# Patient Record
Sex: Male | Born: 1940 | Race: White | Hispanic: No | Marital: Married | Smoking: Former smoker
Health system: Southern US, Community
[De-identification: ages and names within clinical notes are randomized; demographics above are authoritative.]

## PROBLEM LIST (undated history)

## (undated) DIAGNOSIS — N400 Enlarged prostate without lower urinary tract symptoms: Secondary | ICD-10-CM

## (undated) DIAGNOSIS — M109 Gout, unspecified: Secondary | ICD-10-CM

## (undated) DIAGNOSIS — N329 Bladder disorder, unspecified: Secondary | ICD-10-CM

## (undated) DIAGNOSIS — Z87442 Personal history of urinary calculi: Secondary | ICD-10-CM

## (undated) HISTORY — PX: NO PAST SURGERIES: SHX2092

## (undated) HISTORY — PX: COLONOSCOPY: SHX174

---

## 2012-06-14 ENCOUNTER — Encounter (HOSPITAL_BASED_OUTPATIENT_CLINIC_OR_DEPARTMENT_OTHER): Payer: Self-pay

## 2012-06-14 ENCOUNTER — Emergency Department (HOSPITAL_BASED_OUTPATIENT_CLINIC_OR_DEPARTMENT_OTHER)
Admission: EM | Admit: 2012-06-14 | Discharge: 2012-06-14 | Disposition: A | Discharge status: Home / Self Care | Payer: Medicare Other | Attending: Emergency Medicine | Admitting: Emergency Medicine

## 2012-06-14 DIAGNOSIS — R102 Pelvic and perineal pain: Secondary | ICD-10-CM

## 2012-06-14 DIAGNOSIS — N329 Bladder disorder, unspecified: Secondary | ICD-10-CM | POA: Insufficient documentation

## 2012-06-14 DIAGNOSIS — R109 Unspecified abdominal pain: Secondary | ICD-10-CM | POA: Insufficient documentation

## 2012-06-14 HISTORY — DX: Bladder disorder, unspecified: N32.9

## 2012-06-14 LAB — URINALYSIS, ROUTINE W REFLEX MICROSCOPIC
Bilirubin Urine: NEGATIVE
Hgb urine dipstick: NEGATIVE
Specific Gravity, Urine: 1.017 (ref 1.005–1.030)
pH: 6 (ref 5.0–8.0)

## 2012-06-14 NOTE — ED Notes (Signed)
Pt reports urinary retention.  He was seen by PMD yesterday but is still having difficulty emptying bladder.

## 2012-06-14 NOTE — ED Notes (Signed)
MD at bedside. 

## 2012-06-14 NOTE — ED Provider Notes (Signed)
I saw and evaluated the patient, reviewed the resident's note and I agree with the findings and plan.   .Face to face Exam:  General:  Awake HEENT:  Atraumatic Resp:  Normal effort Abd:  Nondistended Neuro:No focal weakness Lymph: No adenopathy   Nelia Shi, MD 06/14/12 1055

## 2012-06-14 NOTE — ED Provider Notes (Signed)
History     CSN: [id number]  Arrival date & time 06/14/12  7829   First MD Initiated Contact with Patient 06/14/12 [phone]      Chief Complaint  Patient presents with  . Urinary Retention    (Consider location/radiation/quality/duration/timing/severity/associated sxs/prior treatment) HPI Patient presents with complaints of suprapubic pain and difficulty urinating since 1200 yesterday. He states that he has been able to urinate since that time, but has had to strain significantly to void. He states that the volume of urine he has produced in this time might be decreased from normal, but he is not certain. Admits to mild dysuria for the same period of time. Denies hematuria. Denies flank pain.   Patient states he is seen regularly by a urologist, Dr. Sherron Monday, for what sounds to be BPH. He is prescribed flomax which he takes everyday. He also states he receives a treatment every 3 weeks for this issue, but is unsure of specifics. He denies any recent changes to his medications, and states flomax is the only med he takes. Denies any previous episodes of retention, or the need for bladder catheterization.   Past Medical History  Diagnosis Date  . Bladder troubles     History reviewed. No pertinent past surgical history.  History reviewed. No pertinent family history.  History  Substance Use Topics  . Smoking status: Never Smoker   . Smokeless tobacco: Never Used  . Alcohol Use: No      Review of Systems  All other systems reviewed and are negative.    Allergies  Review of patient's allergies indicates no known allergies.  Home Medications  No current outpatient prescriptions on file.  BP 146/88  Pulse 97  Temp 97.8 F (36.6 C) (Oral)  Resp 18  Ht 5\' 6"  (1.676 m)  Wt 145 lb (65.772 kg)  BMI 23.40 kg/m2  SpO2 99%  Physical Exam  Constitutional: He is oriented to person, place, and time. He appears well-developed and well-nourished. No distress.  HENT:  Head:  Normocephalic and atraumatic.  Eyes: Pupils are equal, round, and reactive to light.  Cardiovascular: Normal rate and regular rhythm.   No murmur heard. Pulmonary/Chest: Effort normal. He has no wheezes. He has no rales.  Abdominal: Soft. He exhibits no distension. There is tenderness (suprapubic). There is no rebound and no guarding.  Neurological: He is alert and oriented to person, place, and time.  Skin: Skin is warm and dry. No rash noted.  Psychiatric: He has a normal mood and affect. His behavior is normal.    ED Course  Procedures (including critical care time)   Labs Reviewed  URINALYSIS, ROUTINE W REFLEX MICROSCOPIC   No results found.   1. Suprapubic pain, acute       MDM  Patient unable to void after 30 minutes in the ED. Foley catheter placed, with only ~150cc or urine produced. Foley d/c'd. Patient is able to urinate, although with some straining. Patient instructed to f/u with Dr. Sherron Monday, as he does not appear to have acute urinary retention.        Elfredia Nevins, MD 06/14/12 [phone]

## 2012-06-30 ENCOUNTER — Encounter (HOSPITAL_BASED_OUTPATIENT_CLINIC_OR_DEPARTMENT_OTHER): Payer: Self-pay | Admitting: *Deleted

## 2012-06-30 ENCOUNTER — Emergency Department (HOSPITAL_BASED_OUTPATIENT_CLINIC_OR_DEPARTMENT_OTHER)
Admission: EM | Admit: 2012-06-30 | Discharge: 2012-06-30 | Disposition: A | Discharge status: Home / Self Care | Payer: Medicare Other | Attending: Emergency Medicine | Admitting: Emergency Medicine

## 2012-06-30 DIAGNOSIS — R339 Retention of urine, unspecified: Secondary | ICD-10-CM

## 2012-06-30 DIAGNOSIS — Z87448 Personal history of other diseases of urinary system: Secondary | ICD-10-CM | POA: Insufficient documentation

## 2012-06-30 LAB — URINE MICROSCOPIC-ADD ON

## 2012-06-30 LAB — URINALYSIS, ROUTINE W REFLEX MICROSCOPIC
Glucose, UA: NEGATIVE mg/dL
Specific Gravity, Urine: 1.012 (ref 1.005–1.030)
pH: 6 (ref 5.0–8.0)

## 2012-06-30 NOTE — ED Notes (Signed)
States he is not urinating like he should. Poor historian. Drove himself here.

## 2012-06-30 NOTE — ED Provider Notes (Signed)
History     CSN: [id number]  Arrival date & time 06/30/12  1533   None     Chief Complaint  Patient presents with  . Urinary Retention    (Consider location/radiation/quality/duration/timing/severity/associated sxs/prior treatment) HPI Comments: Patient complains of inability to urinate. Patient made multiple attempts while in the emergency department. Patient request Foley catheter do to discomfort. Patient immediately wanted catheter removed after having placed a refill reports feeling better and does not want catheter to remain and wants to go home. Patient reports this has happened him multiple times in the past and he is usually able to resume urination after pressure is removed from his bladder. Patient is followed by Dr. Marius Ditch and since the end is currently on Flomax. Patient reports nothing makes urination easier nothing seems to make it worse he denies any fever or chills he denies any burning with urination he denies any symptoms of urinary tract infection  The history is provided by the patient. No language interpreter was used.    Past Medical History  Diagnosis Date  . Bladder troubles     History reviewed. No pertinent past surgical history.  No family history on file.  History  Substance Use Topics  . Smoking status: Never Smoker   . Smokeless tobacco: Never Used  . Alcohol Use: No      Review of Systems  Genitourinary: Positive for decreased urine volume.  All other systems reviewed and are negative.    Allergies  Review of patient's allergies indicates no known allergies.  Home Medications  No current outpatient prescriptions on file.  BP 137/84  Pulse 108  Temp 97.7 F (36.5 C) (Oral)  Resp 18  Ht 5\' 5"  (1.651 m)  Wt 120 lb (54.432 kg)  BMI 19.97 kg/m2  SpO2 97%  Physical Exam  Nursing note and vitals reviewed. Constitutional: He appears well-developed and well-nourished.  HENT:  Head: Normocephalic and atraumatic.    Cardiovascular: Normal rate and normal heart sounds.   Pulmonary/Chest: Effort normal and breath sounds normal.  Abdominal: Soft. Bowel sounds are normal.  Musculoskeletal: Normal range of motion.  Neurological: He is alert.  Skin: Skin is warm.  Psychiatric: He has a normal mood and affect.    ED Course  Procedures (including critical care time)  Labs Reviewed  URINALYSIS, ROUTINE W REFLEX MICROSCOPIC - Abnormal; Notable for the following:    Hgb urine dipstick SMALL (*)     All other components within normal limits  URINE MICROSCOPIC-ADD ON   No results found.   1. Urinary retention       MDM  Patient had 300 cc of urine output. Urinalysis is negative. I advised patient that he may have further retention he does not want Foley left in. I advised him to followup with Dr. Jacquelyne Balint and he can return to the emergency department if symptoms worsen or change.       Lonia Skinner [place], [place] 06/30/12 1729

## 2012-06-30 NOTE — ED Provider Notes (Signed)
Medical screening examination/treatment/procedure(s) were performed by non-physician practitioner and as supervising physician I was immediately available for consultation/collaboration.  Ethelda Chick, MD 06/30/12 [phone]

## 2012-08-05 ENCOUNTER — Encounter (HOSPITAL_COMMUNITY): Payer: Self-pay | Admitting: Emergency Medicine

## 2012-08-05 ENCOUNTER — Inpatient Hospital Stay (HOSPITAL_COMMUNITY)
Admission: EM | Admit: 2012-08-05 | Discharge: 2012-08-11 | DRG: 371 | Disposition: A | Discharge status: Skilled Nursing Facility | Payer: Medicare Other | Attending: Internal Medicine | Admitting: Internal Medicine

## 2012-08-05 ENCOUNTER — Emergency Department (HOSPITAL_COMMUNITY): Payer: Medicare Other

## 2012-08-05 DIAGNOSIS — R4182 Altered mental status, unspecified: Secondary | ICD-10-CM

## 2012-08-05 DIAGNOSIS — R5383 Other fatigue: Secondary | ICD-10-CM | POA: Diagnosis present

## 2012-08-05 DIAGNOSIS — R197 Diarrhea, unspecified: Secondary | ICD-10-CM

## 2012-08-05 DIAGNOSIS — E46 Unspecified protein-calorie malnutrition: Secondary | ICD-10-CM

## 2012-08-05 DIAGNOSIS — F32A Depression, unspecified: Secondary | ICD-10-CM

## 2012-08-05 DIAGNOSIS — Z9181 History of falling: Secondary | ICD-10-CM

## 2012-08-05 DIAGNOSIS — D72829 Elevated white blood cell count, unspecified: Secondary | ICD-10-CM

## 2012-08-05 DIAGNOSIS — F329 Major depressive disorder, single episode, unspecified: Secondary | ICD-10-CM | POA: Diagnosis present

## 2012-08-05 DIAGNOSIS — N138 Other obstructive and reflux uropathy: Secondary | ICD-10-CM | POA: Diagnosis present

## 2012-08-05 DIAGNOSIS — IMO0002 Reserved for concepts with insufficient information to code with codable children: Secondary | ICD-10-CM

## 2012-08-05 DIAGNOSIS — A0472 Enterocolitis due to Clostridium difficile, not specified as recurrent: Principal | ICD-10-CM

## 2012-08-05 DIAGNOSIS — R339 Retention of urine, unspecified: Secondary | ICD-10-CM

## 2012-08-05 DIAGNOSIS — N390 Urinary tract infection, site not specified: Secondary | ICD-10-CM

## 2012-08-05 DIAGNOSIS — Z79899 Other long term (current) drug therapy: Secondary | ICD-10-CM

## 2012-08-05 DIAGNOSIS — Z681 Body mass index (BMI) 19 or less, adult: Secondary | ICD-10-CM

## 2012-08-05 DIAGNOSIS — E43 Unspecified severe protein-calorie malnutrition: Secondary | ICD-10-CM | POA: Diagnosis present

## 2012-08-05 DIAGNOSIS — Z87891 Personal history of nicotine dependence: Secondary | ICD-10-CM

## 2012-08-05 DIAGNOSIS — R5381 Other malaise: Secondary | ICD-10-CM | POA: Diagnosis present

## 2012-08-05 DIAGNOSIS — E860 Dehydration: Secondary | ICD-10-CM

## 2012-08-05 DIAGNOSIS — R627 Adult failure to thrive: Secondary | ICD-10-CM | POA: Diagnosis present

## 2012-08-05 DIAGNOSIS — F3289 Other specified depressive episodes: Secondary | ICD-10-CM | POA: Diagnosis present

## 2012-08-05 DIAGNOSIS — N401 Enlarged prostate with lower urinary tract symptoms: Secondary | ICD-10-CM | POA: Diagnosis present

## 2012-08-05 DIAGNOSIS — M109 Gout, unspecified: Secondary | ICD-10-CM

## 2012-08-05 HISTORY — DX: Gout, unspecified: M10.9

## 2012-08-05 LAB — CBC
HCT: 41.6 % (ref 39.0–52.0)
MCH: 29.7 pg (ref 26.0–34.0)
MCH: 29 pg (ref 26.0–34.0)
MCV: 86.3 fL (ref 78.0–100.0)
Platelets: 222 10*3/uL (ref 150–400)
Platelets: 204 10*3/uL (ref 150–400)
RBC: 5.28 MIL/uL (ref 4.22–5.81)
RBC: 4.82 MIL/uL (ref 4.22–5.81)
WBC: 13.6 10*3/uL — ABNORMAL HIGH (ref 4.0–10.5)

## 2012-08-05 LAB — COMPREHENSIVE METABOLIC PANEL
ALT: 26 U/L (ref 0–53)
AST: 20 U/L (ref 0–37)
CO2: 27 mEq/L (ref 19–32)
Calcium: 9.7 mg/dL (ref 8.4–10.5)
Chloride: 96 mEq/L (ref 96–112)
GFR calc non Af Amer: 84 mL/min — ABNORMAL LOW (ref >90)
Sodium: 133 mEq/L — ABNORMAL LOW (ref 135–145)

## 2012-08-05 LAB — URINALYSIS, ROUTINE W REFLEX MICROSCOPIC
Glucose, UA: NEGATIVE mg/dL
Protein, ur: NEGATIVE mg/dL
Specific Gravity, Urine: 1.015 (ref 1.005–1.030)

## 2012-08-05 LAB — URINE MICROSCOPIC-ADD ON

## 2012-08-05 LAB — PHOSPHORUS: Phosphorus: 2.7 mg/dL (ref 2.3–4.6)

## 2012-08-05 LAB — OCCULT BLOOD X 1 CARD TO LAB, STOOL: Fecal Occult Bld: NEGATIVE

## 2012-08-05 MED ORDER — DEXTROSE 5 % IV SOLN
1.0000 g | INTRAVENOUS | Status: DC
  Administered 2012-08-05: 1 g via INTRAVENOUS
  Filled 2012-08-05 (×2): qty 10

## 2012-08-05 MED ORDER — SODIUM CHLORIDE 0.9 % IV SOLN: INTRAVENOUS | Status: DC

## 2012-08-05 MED ORDER — ACETAMINOPHEN 325 MG PO TABS
650.0000 mg | ORAL_TABLET | Freq: Four times a day (QID) | ORAL | Status: DC | PRN
  Administered 2012-08-09 – 2012-08-10 (×2): 650 mg via ORAL
  Filled 2012-08-05 (×3): qty 2

## 2012-08-05 MED ORDER — ENSURE COMPLETE PO LIQD
237.0000 mL | Freq: Three times a day (TID) | ORAL | Status: DC
  Administered 2012-08-05 – 2012-08-11 (×16): 237 mL via ORAL
  Filled 2012-08-05 (×2): qty 237

## 2012-08-05 MED ORDER — CLONAZEPAM 0.5 MG PO TABS
0.5000 mg | ORAL_TABLET | Freq: Every evening | ORAL | Status: DC | PRN
  Administered 2012-08-05 – 2012-08-11 (×5): 0.5 mg via ORAL
  Filled 2012-08-05 (×5): qty 1

## 2012-08-05 MED ORDER — SODIUM CHLORIDE 0.9 % IV SOLN
1000.0000 mL | INTRAVENOUS | Status: DC
  Administered 2012-08-05: 1000 mL via INTRAVENOUS

## 2012-08-05 MED ORDER — ENOXAPARIN SODIUM 30 MG/0.3ML ~~LOC~~ SOLN: 30.0000 mg | SUBCUTANEOUS | Status: DC

## 2012-08-05 MED ORDER — SODIUM CHLORIDE 0.9 % IV SOLN
INTRAVENOUS | Status: DC
  Administered 2012-08-05 – 2012-08-06 (×4): via INTRAVENOUS

## 2012-08-05 MED ORDER — ONDANSETRON HCL 4 MG PO TABS: 4.0000 mg | ORAL_TABLET | Freq: Four times a day (QID) | ORAL | Status: DC | PRN

## 2012-08-05 MED ORDER — FINASTERIDE 5 MG PO TABS
5.0000 mg | ORAL_TABLET | Freq: Every day | ORAL | Status: DC
  Administered 2012-08-05 – 2012-08-11 (×7): 5 mg via ORAL
  Filled 2012-08-05 (×7): qty 1

## 2012-08-05 MED ORDER — ACETAMINOPHEN 650 MG RE SUPP: 650.0000 mg | Freq: Four times a day (QID) | RECTAL | Status: DC | PRN

## 2012-08-05 MED ORDER — TAMSULOSIN HCL 0.4 MG PO CAPS
0.8000 mg | ORAL_CAPSULE | Freq: Every day | ORAL | Status: DC
  Administered 2012-08-05 – 2012-08-11 (×7): 0.8 mg via ORAL
  Filled 2012-08-05 (×7): qty 2

## 2012-08-05 MED ORDER — ONDANSETRON HCL 4 MG/2ML IJ SOLN: 4.0000 mg | Freq: Four times a day (QID) | INTRAMUSCULAR | Status: DC | PRN

## 2012-08-05 MED ORDER — ENOXAPARIN SODIUM 40 MG/0.4ML ~~LOC~~ SOLN
40.0000 mg | SUBCUTANEOUS | Status: DC
  Administered 2012-08-05 – 2012-08-10 (×6): 40 mg via SUBCUTANEOUS
  Filled 2012-08-05 (×7): qty 0.4

## 2012-08-05 MED ORDER — SODIUM CHLORIDE 0.9 % IV SOLN
1000.0000 mL | Freq: Once | INTRAVENOUS | Status: AC
  Administered 2012-08-05: 1000 mL via INTRAVENOUS

## 2012-08-05 MED ORDER — PHENAZOPYRIDINE HCL 200 MG PO TABS
200.0000 mg | ORAL_TABLET | Freq: Three times a day (TID) | ORAL | Status: DC | PRN
  Administered 2012-08-05 – 2012-08-11 (×7): 200 mg via ORAL
  Filled 2012-08-05 (×4): qty 1

## 2012-08-05 NOTE — ED Notes (Signed)
Pt's family states that pt has been getting increasingly weak.  Has not been eating and drinking well.  Has been seeing doctors for urinary retention but no one can figure out why he is so weak.  Has to be picked up to be put into bed.

## 2012-08-05 NOTE — ED Provider Notes (Signed)
Medical screening examination/treatment/procedure(s) were conducted as a shared visit with non-physician practitioner(s) and myself.  I personally evaluated the patient during the encounter   The patient presents with what appears to be failure to thrive with significant decrease in his cognitive function over the past 3 weeks as well as decreased oral intake.  He has lost a significant amount weight.  Workup in the emergency department is without acute abnormality however I think the patient needs additional medical workup.  The patient also has been having difficulty ambulating without assistance.  He may need physical therapy/occupational therapy/short-term rehabilitation placement.  I spoke with social work who will involve themselves as well as case management.  I also spoke with the hospitalist service to admit this patient the hospital.   Date: 08/05/2012  Rate: 104  Rhythm: Sinus tachycardia  QRS Axis: normal  Intervals: normal  ST/T Wave abnormalities: normal  Conduction Disutrbances: none  Narrative Interpretation:   Old EKG Reviewed: No significant changes noted     Lyanne Co, MD 08/05/12 [phone]

## 2012-08-05 NOTE — Progress Notes (Signed)
Rx Brief Lovenox note Pt wt=54 kg, CrCl >30 Plts=222 Adjusted Lovenox to 40mg  SQ daily for DVT prophylaxis  Lorenza Evangelist 08/05/2012 5:02 PM

## 2012-08-05 NOTE — Progress Notes (Addendum)
CSW received referral for potential disposition needs. CSW attempted to assess patient however pt was being transferred to inpatient floor. CSW will continue to follow to complete assessment and assess for potential disposition and csw needs.   [name], LCSWA  [phone] .08/05/2012 1600  Per chart review, physical therapy and occupational therapy evaluation ordered. Per chart review, patient receiving home health services. Only RN case management orders at this time, for continued home health services. Please consult csw if further needs arise.   [name], LCSWA  [phone]  1900pm

## 2012-08-05 NOTE — ED Notes (Signed)
MD at bedside. 

## 2012-08-05 NOTE — ED Provider Notes (Signed)
History     CSN: [id number]  Arrival date & time 08/05/12  4098   First MD Initiated Contact with Patient 08/05/12 1014      Chief Complaint  Patient presents with  . Weakness  . Failure To Thrive    (Consider location/radiation/quality/duration/timing/severity/associated sxs/prior treatment) HPI Comments: This is a 71 year old male, who presents emergency department with a chief complaint of weakness and failure to thrive. Patient is brought in by his son, who is in town, and states that his father is "widdling away." Patient has been seen her recently for urinary retention. Son states that his father has had a weight loss of approximately 30 pounds past couple of months. He notes that the father had some mental decline.  The patient has not been eating or drinking well.  The son also states that the patient "does sit-ups constantly and unintentionally."  He is too weak to walk.  The history is provided by the patient. No language interpreter was used.    Past Medical History  Diagnosis Date  . Bladder troubles     History reviewed. No pertinent past surgical history.  History reviewed. No pertinent family history.  History  Substance Use Topics  . Smoking status: Never Smoker   . Smokeless tobacco: Never Used  . Alcohol Use: No      Review of Systems  All other systems reviewed and are negative.    Allergies  Review of patient's allergies indicates no known allergies.  Home Medications  No current outpatient prescriptions on file.  BP 110/83  Pulse 109  Temp 97.5 F (36.4 C) (Oral)  Resp 16  SpO2 94%  Physical Exam  Nursing note and vitals reviewed. Constitutional: He is oriented to person, place, and time.       Thin, ill-appearing  HENT:  Head: Normocephalic and atraumatic.  Nose: Nose normal.  Mouth/Throat: Oropharynx is clear and moist. No oropharyngeal exudate.  Eyes: Conjunctivae normal and EOM are normal. Pupils are equal, round, and reactive  to light. Right eye exhibits no discharge. Left eye exhibits no discharge. No scleral icterus.  Neck: Normal range of motion. Neck supple. No JVD present.  Cardiovascular: Normal rate, regular rhythm, normal heart sounds and intact distal pulses.  Exam reveals no gallop and no friction rub.   No murmur heard. Pulmonary/Chest: Effort normal and breath sounds normal. No respiratory distress. He has no wheezes. He has no rales. He exhibits no tenderness.  Abdominal: Soft. Bowel sounds are normal. He exhibits no distension and no mass. There is no tenderness. There is no rebound and no guarding.  Musculoskeletal: Normal range of motion. He exhibits no edema and no tenderness.       Repeated abdominal flexion, almost tic-like  Neurological: He is alert and oriented to person, place, and time. He has normal reflexes.       CN 3-12 intact  Skin: Skin is warm and dry.  Psychiatric: He has a normal mood and affect. His behavior is normal. Judgment and thought content normal.    ED Course  Procedures (including critical care time)   Labs Reviewed  CBC  COMPREHENSIVE METABOLIC PANEL  TROPONIN I  URINALYSIS, ROUTINE W REFLEX MICROSCOPIC   Results for orders placed during the hospital encounter of 08/05/12  CBC      Component Value Range   WBC 13.6 (*) 4.0 - 10.5 K/uL   RBC 5.28  4.22 - 5.81 MIL/uL   Hemoglobin 15.7  13.0 - 17.0 g/dL  HCT 44.7  39.0 - 52.0 %   MCV 84.7  78.0 - 100.0 fL   MCH 29.7  26.0 - 34.0 pg   MCHC 35.1  30.0 - 36.0 g/dL   RDW 16.1  09.6 - 04.5 %   Platelets 222  150 - 400 K/uL  COMPREHENSIVE METABOLIC PANEL      Component Value Range   Sodium 133 (*) 135 - 145 mEq/L   Potassium 4.0  3.5 - 5.1 mEq/L   Chloride 96  96 - 112 mEq/L   CO2 27  19 - 32 mEq/L   Glucose, Bld 116 (*) 70 - 99 mg/dL   BUN 19  6 - 23 mg/dL   Creatinine, Ser 4.09  0.50 - 1.35 mg/dL   Calcium 9.7  8.4 - 81.1 mg/dL   Total Protein 7.0  6.0 - 8.3 g/dL   Albumin 3.4 (*) 3.5 - 5.2 g/dL   AST 20   0 - 37 U/L   ALT 26  0 - 53 U/L   Alkaline Phosphatase 80  39 - 117 U/L   Total Bilirubin 0.7  0.3 - 1.2 mg/dL   GFR calc non Af Amer 84 (*) >90 mL/min   GFR calc Af Amer >90  >90 mL/min  TROPONIN I      Component Value Range   Troponin I <0.30  <0.30 ng/mL  URINALYSIS, ROUTINE W REFLEX MICROSCOPIC      Component Value Range   Color, Urine ORANGE (*) YELLOW   APPearance CLEAR  CLEAR   Specific Gravity, Urine 1.015  1.005 - 1.030   pH 5.5  5.0 - 8.0   Glucose, UA NEGATIVE  NEGATIVE mg/dL   Hgb urine dipstick SMALL (*) NEGATIVE   Bilirubin Urine SMALL (*) NEGATIVE   Ketones, ur TRACE (*) NEGATIVE mg/dL   Protein, ur NEGATIVE  NEGATIVE mg/dL   Urobilinogen, UA 1.0  0.0 - 1.0 mg/dL   Nitrite POSITIVE (*) NEGATIVE   Leukocytes, UA TRACE (*) NEGATIVE  URINE MICROSCOPIC-ADD ON      Component Value Range   Squamous Epithelial / LPF RARE  RARE   WBC, UA 0-2  <3 WBC/hpf   RBC / HPF 7-10  <3 RBC/hpf   Bacteria, UA FEW (*) RARE   Dg Chest 2 View  08/05/2012  *RADIOLOGY REPORT*  Clinical Data: Weakness, failure to thrive.  CHEST - 2 VIEW  Comparison: None.  Findings: Heart is normal size.  Lungs are clear.  No effusions. No acute bony abnormality.  IMPRESSION: No acute cardiopulmonary disease.   Original Report Authenticated By: Charlett Nose, M.D.    Ct Head Wo Contrast  08/05/2012  *RADIOLOGY REPORT*  Clinical Data: Weakness, failure to thrive.  CT HEAD WITHOUT CONTRAST  Technique:  Contiguous axial images were obtained from the base of the skull through the vertex without contrast.  Comparison: None.  Findings: Atherosclerotic and physiologic intracranial calcifications. Diffuse parenchymal atrophy. Patchy areas of hypoattenuation in deep and periventricular white matter bilaterally. Negative for acute intracranial hemorrhage, mass lesion, acute infarction, midline shift, or mass-effect. Acute infarct may be inapparent on noncontrast CT. Ventricles and sulci symmetric. Bone windows  demonstrate no focal lesion.  IMPRESSION:  1. Negative for bleed or other acute intracranial process.  2. Atrophy and nonspecific white matter changes   Original Report Authenticated By: D. Andria Rhein, MD       1. Altered mental status       MDM  71 year old male with failure to  thrive.  Discussed the patient with Dr. Patria Mane, who has agreed to also see the patient.  Will order labs and give fluids.  3:47 PM Patient will admitted for further workup of weight loss and failure to thrive.  Dr. Patria Mane called for admission.       Roxy Horseman, PA-C 08/05/12 [phone]

## 2012-08-05 NOTE — ED Notes (Signed)
RN to obtain labs with start of IV 

## 2012-08-05 NOTE — ED Notes (Signed)
PA at bedside.

## 2012-08-05 NOTE — H&P (Addendum)
[facility] History and Physical  Brian Salinas ZOX:[id number] DOB: [birth year] DOA: 08/05/2012  Referring physician: Dr Patria Mane. PCP: Sid Falcon, MD    Chief Complaint: Weakness, unable to walk.   HPI: Brian Salinas is a 71 y.o. male with PMH significant for BPH ?, Gout who was brought to the ED by family because patient has been very weak, unable to walk, unable to feed him self, not eating for last  2 weeks,  worse over last 3 days. He has had problems with prostate enlargement. He saw an urologist and was started on medications. Per son, patient sometimes get confuse. The son also states that the patient "does sit-ups constantly and unintentionally." Over the last 4 months he has lost significant amount of weight. Patient has been declining over last 4 month but worse over last 2 weeks.  Patient fell the  night prior to admission when he was trying to stand up from a chair. Patient relates he felt  lightheaded prior to the fall.  Patients relates dysuria, increase frequency, decrease urine stream. He was not able to tell me when this symptoms started. He also relates diarrhea, multiple BM, watery. He denies vomiting, abdominal pain.  Patient is able to follow command, he is oriented to place and person, year. He said he has 3 children, 2 boy 1 girl. He is retired, he works in [employer].  Patient is frequently saying I need to urinate. I need to stand up.   Review of Systems: The patient denies  fever, vision loss, decreased hearing, hoarseness, chest pain, syncope, peripheral edema, hemoptysis, abdominal pain, melena, hematochezia, severe indigestion/heartburn, genital sores, muscle weakness,  transient blindness, difficulty walking, depression, abnormal bleeding.   Past Medical History  Diagnosis Date  . Bladder troubles   . Gout    Past Surgical History  Procedure Date  . No past surgeries    Social History:  reports that he quit smoking about 15 years ago. His  smoking use included Cigarettes. He quit after 20 years of use. He has never used smokeless tobacco. He reports that he does not drink alcohol or use illicit drugs. He lives with son.   No Known Allergies  Family History: Non Contributory.   Prior to Admission medications   Medication Sig Start Date End Date Taking? Authorizing Provider  clonazePAM (KLONOPIN) 0.5 MG tablet Take 0.5 mg by mouth at bedtime as needed. For sleep/anxiety.   Yes Historical Provider, MD  finasteride (PROSCAR) 5 MG tablet Take 5 mg by mouth daily.   Yes Historical Provider, MD  levofloxacin (LEVAQUIN) 500 MG tablet Take 500 mg by mouth daily.   Yes Historical Provider, MD  phenazopyridine (PYRIDIUM) 200 MG tablet Take 200 mg by mouth 3 (three) times daily as needed. For pain.   Yes Historical Provider, MD  Tamsulosin HCl (FLOMAX) 0.4 MG CAPS Take 0.8 mg by mouth daily.   Yes Historical Provider, MD   Physical Exam: Filed Vitals:   08/05/12 1006 08/05/12 1458  BP: 110/83 129/83  Pulse: 109 105  Temp: 97.5 F (36.4 C)   TempSrc: Oral   Resp: 16 16  SpO2: 94% 95%    General Appearance:    Alert, cooperative, no distress, cachetic, lying in fetal position.   Head:    Normocephalic, without obvious abnormality, atraumatic  Eyes:    PERRL, conjunctiva/corneas clear, EOM's intact        Ears:    Normal TM's and external ear canals, both ears  Nose:  Nares normal, septum midline, mucosa normal, no drainage    or sinus tenderness  Throat:   Lips, mucosa, and tongue dry; no denture.   Neck:   Supple, symmetrical, trachea midline, no adenopathy;       thyroid:  No enlargement/tenderness/nodules; no carotid   bruit or JVD     Lungs:     Clear to auscultation bilaterally, respirations unlabored  Chest wall:    No tenderness or deformity  Heart:    Regular rate, Tachycardic, S1 and S2 normal, no murmur, rub   or gallop  Abdomen:     Soft, non-tender, bowel sounds active all four quadrants,    no masses, no  organomegaly        Extremities:   Extremities  atraumatic, no cyanosis or edema.  Pulses:   2+ and symmetric all extremities  Skin:   Skin dry, scaly, bruises, hyperpigmentation  lower extremities.      Neurologic:   CNII-XII intact. Normal strength, sensation and reflexes      Throughout. Oriented to place , person and time. Cooperative, following command.    Labs on Admission:  Basic Metabolic Panel:  Lab 08/05/12 1610  NA 133*  K 4.0  CL 96  CO2 27  GLUCOSE 116*  BUN 19  CREATININE 0.93  CALCIUM 9.7  MG --  PHOS --   Liver Function Tests:  Lab 08/05/12 1049  AST 20  ALT 26  ALKPHOS 80  BILITOT 0.7  PROT 7.0  ALBUMIN 3.4*   CBC:  Lab 08/05/12 1049  WBC 13.6*  NEUTROABS --  HGB 15.7  HCT 44.7  MCV 84.7  PLT 222   Cardiac Enzymes:  Lab 08/05/12 1049  CKTOTAL --  CKMB --  CKMBINDEX --  TROPONINI <0.30    BNP (last 3 results) No results found for this basename: PROBNP:3 in the last 8760 hours CBG: No results found for this basename: GLUCAP:5 in the last 168 hours  Radiological Exams on Admission: Dg Chest 2 View  08/05/2012  *RADIOLOGY REPORT*  Clinical Data: Weakness, failure to thrive.  CHEST - 2 VIEW  Comparison: None.  Findings: Heart is normal size.  Lungs are clear.  No effusions. No acute bony abnormality.  IMPRESSION: No acute cardiopulmonary disease.   Original Report Authenticated By: Charlett Nose, M.D.    Ct Head Wo Contrast  08/05/2012  *RADIOLOGY REPORT*  Clinical Data: Weakness, failure to thrive.  CT HEAD WITHOUT CONTRAST  Technique:  Contiguous axial images were obtained from the base of the skull through the vertex without contrast.  Comparison: None.  Findings: Atherosclerotic and physiologic intracranial calcifications. Diffuse parenchymal atrophy. Patchy areas of hypoattenuation in deep and periventricular white matter bilaterally. Negative for acute intracranial hemorrhage, mass lesion, acute infarction, midline shift, or  mass-effect. Acute infarct may be inapparent on noncontrast CT. Ventricles and sulci symmetric. Bone windows demonstrate no focal lesion.  IMPRESSION:  1. Negative for bleed or other acute intracranial process.  2. Atrophy and nonspecific white matter changes   Original Report Authenticated By: D. Andria Rhein, MD     EKG: Independently reviewed. Sinus Tachycardia.   Assessment/Plan Principal Problem:  *Failure to thrive Active Problems:  UTI (lower urinary tract infection)  Altered mental status  Leukocytosis  1-UTI: Patient with dysuria, increase frequency, urine retention. UA with nitrates positive, leukocytes trace, few bacteria. Patient had 500 cc bladder retention. Will start IV ceftriaxone, place foley catheter. IV fluids. Urine culture.   2-Failure to thrive/AMS: Patient with weight  loss, decrease oral intake, inability to walk. Worsening symptoms over last 2 weeks probably secondary to infection. In the differential malignancy, Dementia, thyroid diseases, Malabsorption . I will order TSH, B-12, RPR, PSA, HIV, Guaiac stool, cortisol level. He will need work up for malignancy. Will ask for records from PCP office. PT and OT consult. CT head negative for acute bleed, Atrophy and nonspecific white matter changes. Chest X ray : No acute cardiopulmonary disease.  3-Diarrhea: He was on antibiotics. I will check for C. diff, ova and parasite, stool culture.  4-Urine retention, He has history of prostate enlargement. I will continue with Flomax. Check PSA. He will need to follow up with his urologist.  5-Frequent fall: Check orthostatic, fall precaution. PT consult.  6-Dehydration: IV fluids.     Family Communication: Son at bedside. Plan of care discussed with them.  Disposition Plan: Admit for treatment UTI, AMS.   Time spent: more than 70 minutes.  Pinkey Mcjunkin [facility] Pager [phone]  If 7PM-7AM, please contact night-coverage [email or web address] Password [email or web address] 08/05/2012,  5:02 PM   Malnutrition: Ensure. Work up in process.

## 2012-08-05 NOTE — ED Notes (Signed)
Family states pt has been increasingly weak over the past month, with the worst symptoms in the past 4 days.  Family states pt is normally able to ambulate on his own, but now has to be picked up and moved in the bed.  Pt denies any pain.  Family state pt has been having frequent falls.  Hit head during fall 2 weeks ago, denies blood thinners.

## 2012-08-06 ENCOUNTER — Observation Stay (HOSPITAL_COMMUNITY): Payer: Medicare Other

## 2012-08-06 DIAGNOSIS — A0472 Enterocolitis due to Clostridium difficile, not specified as recurrent: Principal | ICD-10-CM

## 2012-08-06 DIAGNOSIS — E860 Dehydration: Secondary | ICD-10-CM

## 2012-08-06 DIAGNOSIS — E46 Unspecified protein-calorie malnutrition: Secondary | ICD-10-CM | POA: Diagnosis present

## 2012-08-06 DIAGNOSIS — R197 Diarrhea, unspecified: Secondary | ICD-10-CM | POA: Diagnosis present

## 2012-08-06 DIAGNOSIS — R339 Retention of urine, unspecified: Secondary | ICD-10-CM

## 2012-08-06 LAB — CBC
Platelets: 185 10*3/uL (ref 150–400)
RBC: 4.35 MIL/uL (ref 4.22–5.81)
RDW: 12.7 % (ref 11.5–15.5)
WBC: 13.7 10*3/uL — ABNORMAL HIGH (ref 4.0–10.5)

## 2012-08-06 LAB — BASIC METABOLIC PANEL
CO2: 28 mEq/L (ref 19–32)
Calcium: 8.5 mg/dL (ref 8.4–10.5)
Creatinine, Ser: 0.94 mg/dL (ref 0.50–1.35)
GFR calc Af Amer: >90 mL/min (ref >90)
GFR calc non Af Amer: 83 mL/min — ABNORMAL LOW (ref >90)
Sodium: 136 mEq/L (ref 135–145)

## 2012-08-06 LAB — HIV ANTIBODY (ROUTINE TESTING W REFLEX): HIV: NONREACTIVE

## 2012-08-06 LAB — CORTISOL-AM, BLOOD: Cortisol - AM: 15.6 ug/dL (ref 4.3–22.4)

## 2012-08-06 LAB — TSH: TSH: 0.69 u[IU]/mL (ref 0.350–4.500)

## 2012-08-06 MED ORDER — METRONIDAZOLE 500 MG PO TABS
500.0000 mg | ORAL_TABLET | Freq: Three times a day (TID) | ORAL | Status: DC
  Administered 2012-08-06 – 2012-08-07 (×3): 500 mg via ORAL
  Filled 2012-08-06 (×6): qty 1

## 2012-08-06 NOTE — Progress Notes (Signed)
Orthostatic vs ordered.  Pt able to tolerate sitting for 2 min but pt can not stand for 2 min  even with someone holding him up.  The vs sitting were unchanged from the ones lying. Barnett Hatter P

## 2012-08-06 NOTE — Evaluation (Signed)
Occupational Therapy Evaluation Patient Details Name: Brian Salinas MRN: [id number] DOB: 30-May-1941 Today's Date: 08/06/2012 Time: 4098-1191 OT Time Calculation (min): 30 min  OT Assessment / Plan / Recommendation Clinical Impression  admitted on 12/27 with progressive weakness, inability to walk, unable to perform ADLs, poor oral intake, some confusion, diarrhea, lightheadedness and a fall, dysuria and difficulty passing urine. Per family, he has been gradually declining over the last 4 months but was still able to perform his ADLs and was doing relatively okay until 3 weeks ago. He has rapidly declined in the last 3-4 days. Pt would require 24 total A if returning home. Son appears receptive to short term snf. Skilled OT indicated to address the below mentioned deficits in prep for d/c to next venue.    OT Assessment  Patient needs continued OT Services    Follow Up Recommendations  SNF;Supervision/Assistance - 24 hour    Barriers to Discharge Decreased caregiver support;Inaccessible home environment    Equipment Recommendations  3 in 1 bedside comode;Wheelchair (measurements OT);Hospital bed;Wheelchair cushion (measurements OT)    Recommendations for Other Services    Frequency  Min 2X/week    Precautions / Restrictions Precautions Precautions: Fall   Pertinent Vitals/Pain Pt denied pain.    ADL  Grooming: Moderate assistance Where Assessed - Grooming: Unsupported sitting Upper Body Bathing: Maximal assistance Where Assessed - Upper Body Bathing: Unsupported sitting Lower Body Bathing: +2 Total assistance Lower Body Bathing: Patient Percentage: 50% (for sit<>stand only. Pt unable to physically complete task.) Where Assessed - Lower Body Bathing: Supported sit to stand Upper Body Dressing: Maximal assistance Where Assessed - Upper Body Dressing: Unsupported sitting Lower Body Dressing: +2 Total assistance (for sit<>stand only. Pt unable to physically complete  task.) Lower Body Dressing: Patient Percentage: 50% Where Assessed - Lower Body Dressing: Supported sit to stand Toilet Transfer: +2 Total assistance Toilet Transfer: Patient Percentage: 50% Toilet Transfer Method: Sit to stand Toileting - Architect and Hygiene: +2 Total assistance Toileting - Clothing Manipulation and Hygiene: Patient Percentage:  (for sit<>stand only. Pt unable to physically complete task.) Where Assessed - Toileting Clothing Manipulation and Hygiene: Standing Equipment Used: Rolling walker ADL Comments: Pt lacks initiation to complete any ADL activity. Appears extremely fatigued, lethargic. Pt stood with +2 A and took several shuffling side steps to [facility]. Pt has been incontinent of watery stool all day per RN and tech.    OT Diagnosis: Generalized weakness  OT Problem List: Decreased strength;Decreased cognition;Decreased safety awareness;Decreased activity tolerance;Decreased knowledge of use of DME or AE;Impaired balance (sitting and/or standing) OT Treatment Interventions: Self-care/ADL training;Therapeutic activities;DME and/or AE instruction;Patient/family education;Balance training   OT Goals Acute Rehab OT Goals OT Goal Formulation: With patient/family Time For Goal Achievement: 08/20/12 ADL Goals Pt Will Perform Grooming: with set-up;Sitting, chair;Sitting, edge of bed;Unsupported ADL Goal: Grooming - Progress: Goal set today Pt Will Transfer to Toilet: with mod assist;Stand pivot transfer;3-in-1;Squat pivot transfer ADL Goal: Toilet Transfer - Progress: Goal set today Pt Will Perform Toileting - Clothing Manipulation: with max assist;Sitting on 3-in-1 or toilet;Standing ADL Goal: Toileting - Clothing Manipulation - Progress: Goal set today Pt Will Perform Toileting - Hygiene: with max assist;Sit to stand from 3-in-1/toilet ADL Goal: Toileting - Hygiene - Progress: Goal set today Additional ADL Goal #1: Pt will complete supine<>sit with min A as a  precurser to ADLs. ADL Goal: Additional Goal #1 - Progress: Goal set today Additional ADL Goal #2: Pt will tolerate supported standing position x1-2 min with min A  in prep for ADL activity.  Visit Information  Last OT Received On: 08/06/12 Assistance Needed: +2    Subjective Data  Subjective: How long do I have to sit up like this for? Patient Stated Goal: Per son,  "For my dad to get back to normal."   Prior Functioning     Home Living Lives With: Son Available Help at Discharge: Family;Available 24 hours/day Type of Home: House Home Access: Stairs to enter [employer] of Steps: 6 Entrance Stairs-Rails: Right Home Layout: One level Bathroom Shower/Tub: Engineer, manufacturing systems: Standard Home Adaptive Equipment: Grab bars in shower Prior Function Level of Independence: Independent Able to Take Stairs?: Yes Driving: No Vocation: Retired Musician: No difficulties Dominant Hand: Right         Vision/Perception     Cognition  Overall Cognitive Status: Impaired Area of Impairment: Attention;Memory;Following commands;Awareness of errors;Safety/judgement Arousal/Alertness: Lethargic Orientation Level: Disoriented to;Place;Situation Behavior During Session: Lethargic Current Attention Level: Sustained Memory: Decreased recall of precautions Following Commands: Follows one step commands inconsistently;Follows multi-step commands with increased time Safety/Judgement: Decreased awareness of safety precautions;Decreased safety judgement for tasks assessed Awareness of Errors: Assistance required to identify errors made;Assistance required to correct errors made    Extremity/Trunk Assessment Right Upper Extremity Assessment RUE ROM/Strength/Tone: Deficits RUE ROM/Strength/Tone Deficits: generally weak per functional observation Left Upper Extremity Assessment LUE ROM/Strength/Tone: Deficits LUE ROM/Strength/Tone Deficits: generally  weak per functional observation     Mobility Bed Mobility Bed Mobility: Supine to Sit;Sit to Supine Supine to Sit: 1: +1 Total assist Sit to Supine: 1: +2 Total assist Sit to Supine: Patient Percentage: 0% Details for Bed Mobility Assistance: Pt made no attempts to self assist. Utilized bed pad. Transfers Transfers: Sit to Stand;Stand to Sit Sit to Stand: 1: +2 Total assist;From elevated surface;With upper extremity assist;From bed Sit to Stand: Patient Percentage: 50% Stand to Sit: 1: +2 Total assist;With upper extremity assist;To bed Stand to Sit: Patient Percentage: 50% Details for Transfer Assistance: Pt initally presents with a posterior lean which he was unable to self correct. Max cues for hand placement and safety.     Shoulder Instructions     Exercise     Balance Static Sitting Balance Static Sitting - Balance Support: Bilateral upper extremity supported;Feet supported Static Sitting - Level of Assistance: 4: Min assist;5: Stand by assistance (varied through sesssion, shoulders rounded and forward head.)   End of Session OT - End of Session Activity Tolerance: Patient limited by fatigue Patient left: in bed;with call bell/phone within reach;with family/visitor present Nurse Communication: Mobility status  GO     Amiir Heckard A OTR/L [phone] 08/06/2012, 2:50 PM

## 2012-08-06 NOTE — Care Management Note (Signed)
UR completed 

## 2012-08-06 NOTE — Progress Notes (Addendum)
[facility] PROGRESS NOTE  Brian Salinas ZOX:[id number] DOB: March 23, 1941 DOA: 08/05/2012 PCP: Sid Falcon, MD  Primary Urologist: Dr. Lorin Picket McDiarmid, [facility].  Brief narrative 71 year old male with history of possible BPH, gout, seen by his primary urologist 3 weeks ago, admitted on 12/27 with progressive weakness, inability to walk, unable to perform ADLs, poor oral intake, some confusion, diarrhea, lightheadedness and a fall, dysuria and difficulty passing urine. Per family, he has been gradually declining over the last 4 months but was still able to perform his ADLs and was doing relatively okay until 3 weeks ago. He has rapidly declined in the last 3-4 days. In the ED, chest x-ray and CT head negative, white blood cell 13.6 and UA positive for nitrites, few bacteria and 0-2 white blood cells. He was admitted for further evaluation and management  Assessment/Plan:  C. difficile colitis  Start oral Flagyl  On contact isolation.  Apparently had a colonoscopy a couple of years ago which was negative. Cannot recollect who did it and when exactly.  Possible UTI/urinary retention/BPH.  Continue Foley catheter at least for another 24 hours and then voiding trial.  UA not very impressive for UTI and given multiple diarrhea from C. difficile colitis-we'll DC Rocephin pending final urine culture results.  Outpatient followup with primary urologist. Continue finasteride and tamsulosin  Dehydration  Secondary to poor oral intake.  Continue IV fluids.   Malnutrition  We'll get nutrition consultation.   Failure to thrive/altered mental status   Patient has been declining over the last couple of months with progressive weight loss, decreased oral intake and generalized weakness. Unclear etiology. Extensive workup initiated by admitting M.D.-follow results.  Differential diagnosis include malignancy, dementia and? Psychiatric illness-per family, patient "does  situps constantly nonintentionally". Followup records from PCP. May consider psychiatric input if medical workup was negative.   Frequent falls  Likely secondary to generalized weakness and deconditioning.  PT OT evaluation. May require SNF for rehabilitation.     Code Status: Discussed with patient's daughter/HCPOA and confirmed Full code. Family Communication: Discussed with son Mr. Leviticus Harton, at bedside and with patients daughter/health care power of attorney Ms. [name] via phone. Disposition Plan: Await PT evaluation. ? SNF   Consultants:  None  Procedures:  Foley 12/27 >  Antibiotics:  IV Rocephin 12/27>>  HPI/Subjective: Feels better. Poor historian. Per son, still very weak and multiple diarrhea.  Objective: Filed Vitals:   08/05/12 2055 08/06/12 0500 08/06/12 0513 08/06/12 0515  BP: 90/64  125/73 133/70  Pulse: 103  101 102  Temp: 98.2 F (36.8 C)  97.7 F (36.5 C)   TempSrc: Oral  Oral   Resp: 18  18 18   Height:      Weight:  49.896 kg (110 lb)    SpO2: 96%  93%     Intake/Output Summary (Last 24 hours) at 08/06/12 1321 Last data filed at 08/06/12 0900  Gross per 24 hour  Intake   1615 ml  Output   3650 ml  Net  -2035 ml   Filed Weights   08/05/12 2042 08/06/12 0500  Weight: 49.896 kg (110 lb) 49.896 kg (110 lb)    Exam:   General exam: Chronically ill looking, cachectic male in no obvious distress.  Respiratory system: Clear to auscultation.  Cardiovascular system: First and second heart sounds heard, regular rate and rhythm. No JVD or murmurs pedal edema.  Gastrointestinal system: Abdomen is nondistended, soft and nontender. Normal bowel sounds heard.  Central nervous  system: Alert and oriented to self, place and partly to time. No focal neurological deficits.  Extremities: Symmetric 5 x 5 power.  Data Reviewed: Basic Metabolic Panel:  Lab 08/06/12 1610 08/05/12 1837 08/05/12 1049  NA 136 -- 133*  K 3.7 -- 4.0   CL 103 -- 96  CO2 28 -- 27  GLUCOSE 110* -- 116*  BUN 17 -- 19  CREATININE 0.94 0.86 0.93  CALCIUM 8.5 -- 9.7  MG -- 2.1 --  PHOS -- 2.7 --   Liver Function Tests:  Lab 08/05/12 1049  AST 20  ALT 26  ALKPHOS 80  BILITOT 0.7  PROT 7.0  ALBUMIN 3.4*   No results found for this basename: LIPASE:5,AMYLASE:5 in the last 168 hours No results found for this basename: AMMONIA:5 in the last 168 hours CBC:  Lab 08/06/12 0409 08/05/12 1837 08/05/12 1049  WBC 13.7* 15.6* 13.6*  NEUTROABS -- -- --  HGB 12.6* 14.0 15.7  HCT 38.0* 41.6 44.7  MCV 87.4 86.3 84.7  PLT 185 204 222   Cardiac Enzymes:  Lab 08/05/12 1049  CKTOTAL --  CKMB --  CKMBINDEX --  TROPONINI <0.30   BNP (last 3 results) No results found for this basename: PROBNP:3 in the last 8760 hours CBG: No results found for this basename: GLUCAP:5 in the last 168 hours  Recent Results (from the past 240 hour(s))  CLOSTRIDIUM DIFFICILE BY PCR     Status: Abnormal   Collection Time   08/05/12  8:00 PM      Component Value Range Status Comment   C difficile by pcr POSITIVE (*) NEGATIVE Final      Studies: Dg Chest 2 View  08/05/2012  *RADIOLOGY REPORT*  Clinical Data: Weakness, failure to thrive.  CHEST - 2 VIEW  Comparison: None.  Findings: Heart is normal size.  Lungs are clear.  No effusions. No acute bony abnormality.  IMPRESSION: No acute cardiopulmonary disease.   Original Report Authenticated By: Charlett Nose, M.D.    Ct Head Wo Contrast  08/05/2012  *RADIOLOGY REPORT*  Clinical Data: Weakness, failure to thrive.  CT HEAD WITHOUT CONTRAST  Technique:  Contiguous axial images were obtained from the base of the skull through the vertex without contrast.  Comparison: None.  Findings: Atherosclerotic and physiologic intracranial calcifications. Diffuse parenchymal atrophy. Patchy areas of hypoattenuation in deep and periventricular white matter bilaterally. Negative for acute intracranial hemorrhage, mass lesion,  acute infarction, midline shift, or mass-effect. Acute infarct may be inapparent on noncontrast CT. Ventricles and sulci symmetric. Bone windows demonstrate no focal lesion.  IMPRESSION:  1. Negative for bleed or other acute intracranial process.  2. Atrophy and nonspecific white matter changes   Original Report Authenticated By: D. Andria Rhein, MD    Dg Abd Portable 1v  08/06/2012  *RADIOLOGY REPORT*  Clinical Data: Abdominal pain.  Diarrhea.  PORTABLE ABDOMEN - 1 VIEW  Comparison: 02/19/2010  Findings: No evidence of dilated bowel loops or abnormal mass effect.  Two radiodensities are again seen in the right abdomen, consistent with renal calculi.  Largest measures 7 mm in diameter.  IMPRESSION:  1.  Normal bowel gas pattern. 2.  Stable right nephrolithiasis.   Original Report Authenticated By: Myles Rosenthal, M.D.     Scheduled Meds:    . enoxaparin (LOVENOX) injection  40 mg Subcutaneous Q24H  . feeding supplement  237 mL Oral TID WC  . finasteride  5 mg Oral Daily  . metroNIDAZOLE  500 mg Oral Q8H  .  Tamsulosin HCl  0.8 mg Oral Daily   Continuous Infusions:    . sodium chloride 100 mL/hr at 08/06/12 0930    Principal Problem:  *Failure to thrive Active Problems:  UTI (lower urinary tract infection)  Altered mental status  Leukocytosis  Urinary retention  Diarrhea  Dehydration  Other lab data  Magnesium 2.1, phosphorus 2.7.  Vitamin B 12: 447.  A.m. cortisol: 15.6.  TSH 0.690.  PSA: 0.78.  RPR nonreactive.  HIV nonreactive  Stool culture, ova and parasites and C. difficile PCR: Pending  Urine culture: Pending  FOBT: Negative  Time spent: 30 minutes.    [facility]  [facility] Pager [phone]. If 8PM-8AM, please contact night-coverage at [email or web address], password [facility] 08/06/2012, 1:21 PM  LOS: 1 day

## 2012-08-06 NOTE — Progress Notes (Signed)
Chart review.  Pt not seen by PT/OT as of this writing.  CSW to continue to follow.  Providence Crosby, LCSWA Clinical Social Work [phone]

## 2012-08-07 DIAGNOSIS — R197 Diarrhea, unspecified: Secondary | ICD-10-CM

## 2012-08-07 LAB — CBC
HCT: 40.6 % (ref 39.0–52.0)
Hemoglobin: 13.6 g/dL (ref 13.0–17.0)
MCH: 29.2 pg (ref 26.0–34.0)
MCHC: 33.5 g/dL (ref 30.0–36.0)
MCV: 87.3 fL (ref 78.0–100.0)
Platelets: 177 10*3/uL (ref 150–400)
RBC: 4.65 MIL/uL (ref 4.22–5.81)
RDW: 12.8 % (ref 11.5–15.5)
WBC: 11.3 10*3/uL — ABNORMAL HIGH (ref 4.0–10.5)

## 2012-08-07 LAB — BASIC METABOLIC PANEL
BUN: 13 mg/dL (ref 6–23)
CO2: 28 mEq/L (ref 19–32)
Calcium: 8.7 mg/dL (ref 8.4–10.5)
Chloride: 102 mEq/L (ref 96–112)
Creatinine, Ser: 0.85 mg/dL (ref 0.50–1.35)
GFR calc Af Amer: >90 mL/min (ref >90)
GFR calc non Af Amer: 87 mL/min — ABNORMAL LOW (ref >90)
Glucose, Bld: 103 mg/dL — ABNORMAL HIGH (ref 70–99)
Potassium: 3.8 mEq/L (ref 3.5–5.1)
Sodium: 138 mEq/L (ref 135–145)

## 2012-08-07 LAB — URINE CULTURE
Colony Count: NO GROWTH
Culture: NO GROWTH

## 2012-08-07 MED ORDER — BIOTENE DRY MOUTH MT LIQD
15.0000 mL | Freq: Two times a day (BID) | OROMUCOSAL | Status: DC
  Administered 2012-08-07 – 2012-08-11 (×7): 15 mL via OROMUCOSAL

## 2012-08-07 MED ORDER — VANCOMYCIN 50 MG/ML ORAL SOLUTION
125.0000 mg | Freq: Four times a day (QID) | ORAL | Status: DC
  Administered 2012-08-07 – 2012-08-11 (×16): 125 mg via ORAL
  Filled 2012-08-07 (×22): qty 2.5

## 2012-08-07 NOTE — Evaluation (Signed)
Physical Therapy Evaluation Patient Details Name: Brian Salinas MRN: [id number] DOB: 07/22/1941 Today's Date: 08/07/2012 Time: 2440-1027 PT Time Calculation (min): 22 min  PT Assessment / Plan / Recommendation Clinical Impression  Pt presents with FTT and history of BPH and gout.  Tolerated OOB and ambulation into hallway, however fatigued quickly and needed to return to room.  Noted that pt is very unsteady with shuffled gait pattern and forward flexed posture.  Pt will benefit from skilled PT in acute venue to address deficits.  PT recommends ST SNF for follow up at D/C to maximize pts safety and independence.     PT Assessment  Patient needs continued PT services    Follow Up Recommendations  SNF;Supervision/Assistance - 24 hour    Does the patient have the potential to tolerate intense rehabilitation      Barriers to Discharge None      Equipment Recommendations  Rolling walker with 5" wheels    Recommendations for Other Services     Frequency Min 3X/week    Precautions / Restrictions Precautions Precautions: Fall Precaution Comments: very shuffled gait pattern.  Restrictions Weight Bearing Restrictions: No   Pertinent Vitals/Pain No pain      Mobility  Bed Mobility Bed Mobility: Rolling Left;Left Sidelying to Sit Rolling Left: 4: Min guard Left Sidelying to Sit: 3: Mod assist Details for Bed Mobility Assistance: min/guard for safety when rolling and assist for trunk when getting into sitting position.  cues for hand placement and technique to self assist.  Transfers Transfers: Sit to Stand;Stand to Sit Sit to Stand: 1: +2 Total assist;With upper extremity assist;From bed Sit to Stand: Patient Percentage: 60% Stand to Sit: 1: +2 Total assist;With upper extremity assist;With armrests;To chair/3-in-1 Stand to Sit: Patient Percentage: 70% Details for Transfer Assistance: Assist to rise and steady with verbal and manual cues for hand placement and safety when  sitting/standing.  Also provided cues for upright posture throughout due to tendency to have forward head/rounded shoulders.  Ambulation/Gait Ambulation/Gait Assistance: 1: +2 Total assist Ambulation/Gait: Patient Percentage: 70% Ambulation Distance (Feet): 25 Feet Assistive device: Rolling walker Ambulation/Gait Assistance Details: Assist to steady throughout with max cues for wider BOS, upright posture and increased stride length.   Gait Pattern: Step-to pattern;Decreased stride length;Narrow base of support;Shuffle;Trunk flexed Gait velocity: decreased Stairs: No Wheelchair Mobility Wheelchair Mobility: No    Shoulder Instructions     Exercises     PT Diagnosis: Difficulty walking;Generalized weakness  PT Problem List: Decreased strength;Decreased activity tolerance;Decreased balance;Decreased mobility;Decreased coordination;Decreased cognition;Decreased knowledge of use of DME;Decreased safety awareness;Decreased knowledge of precautions PT Treatment Interventions: DME instruction;Gait training;Functional mobility training;Therapeutic activities;Therapeutic exercise;Balance training;Patient/family education   PT Goals Acute Rehab PT Goals PT Goal Formulation: With patient/family Time For Goal Achievement: 08/21/12 Potential to Achieve Goals: Good Pt will go Supine/Side to Sit: with supervision PT Goal: Supine/Side to Sit - Progress: Goal set today Pt will go Sit to Supine/Side: with supervision PT Goal: Sit to Supine/Side - Progress: Goal set today Pt will go Sit to Stand: with supervision PT Goal: Sit to Stand - Progress: Goal set today Pt will Ambulate: 51 - 150 feet;with supervision;with least restrictive assistive device PT Goal: Ambulate - Progress: Goal set today Pt will Perform Home Exercise Program: with supervision, verbal cues required/provided PT Goal: Perform Home Exercise Program - Progress: Goal set today  Visit Information  Last PT Received On:  08/07/12 Assistance Needed: +1    Subjective Data  Subjective: I don't like that chair.  Patient Stated Goal: n/a   Prior Functioning  Home Living Lives With: Son Available Help at Discharge: Family;Available 24 hours/day Type of Home: House Home Access: Stairs to enter [employer] of Steps: 6 Entrance Stairs-Rails: Right Home Layout: One level Bathroom Shower/Tub: Engineer, manufacturing systems: Standard Home Adaptive Equipment: Grab bars in shower Prior Function Level of Independence: Independent Able to Take Stairs?: Yes Driving: No Vocation: Retired Musician: No difficulties Dominant Hand: Right    Cognition  Overall Cognitive Status: Impaired Area of Impairment: Attention;Following commands;Safety/judgement;Awareness of errors Arousal/Alertness: Awake/alert Orientation Level: Disoriented to;Place;Situation Behavior During Session: [facility] for tasks performed Current Attention Level: Sustained Memory: Decreased recall of precautions Following Commands: Follows one step commands inconsistently Safety/Judgement: Decreased awareness of safety precautions;Decreased awareness of need for assistance Awareness of Errors: Assistance required to identify errors made;Assistance required to correct errors made    Extremity/Trunk Assessment Right Lower Extremity Assessment RLE ROM/Strength/Tone: Deficits RLE ROM/Strength/Tone Deficits: Pt with generalized weakness, grossly 3/5 per functional assessment.  RLE Sensation: WFL - Light Touch Left Lower Extremity Assessment LLE ROM/Strength/Tone: Deficits LLE ROM/Strength/Tone Deficits: Pt with generalized weakness, grossly 3/5 per functional assessment.  LLE Sensation: WFL - Light Touch Trunk Assessment Trunk Assessment: Kyphotic;Other exceptions Trunk Exceptions: forward head, rounded shoulders.    Balance    End of Session PT - End of Session Activity Tolerance: Patient tolerated treatment  well Patient left: in chair;with call bell/phone within reach;with family/visitor present Nurse Communication: Mobility status  GP     Page, Meribeth Mattes 08/07/2012, 4:48 PM

## 2012-08-07 NOTE — Progress Notes (Signed)
[facility] PROGRESS NOTE  Brian Salinas ZOX:[id number] DOB: 11/01/1940 DOA: 08/05/2012 PCP: Sid Falcon, MD  Primary Urologist: Dr. Lorin Picket McDiarmid, [facility].  Brief narrative 71 year old male with history of possible BPH, gout, seen by his primary urologist 3 weeks ago, admitted on 12/27 with progressive weakness, inability to walk, unable to perform ADLs, poor oral intake, some confusion, diarrhea, lightheadedness and a fall, dysuria and difficulty passing urine. Per family, he has been gradually declining over the last 4 months but was still able to perform his ADLs and was doing relatively okay until 3 weeks ago. He has rapidly declined in the last 3-4 days. In the ED, chest x-ray and CT head negative, white blood cell 13.6 and UA positive for nitrites, few bacteria and 0-2 white blood cells. He was admitted for further evaluation and management  Assessment/Plan:  C. difficile colitis - moderate  Started oral Flagyl on 08/06/12  On contact isolation.  Apparently had a colonoscopy a couple of years ago which was negative. Cannot recollect who did it and when exactly.  Switched to po vancomycin 08/07/12 due to patient having abdominal pain and feeling poorly.   Possible UTI/urinary retention/BPH.  Foley catheter was placed and patient will have voiding trials after rehab in the urologist office  UA not very impressive for UTI and given diarrhea from C. difficile colitis we suspect bacteriuria is asymptomatic - patient did receive iv Rocephin on 08/05/12   Outpatient followup with primary urologist. Continued on  finasteride and tamsulosin  Dehydration  Secondary to poor oral intake.  Patient did receive iv fluids from admission until 08/07/12   Malnutrition  nutrition consultation.   Failure to thrive/altered mental status   Patient has been declining over the last couple of months with progressive weight loss, decreased oral intake and generalized  weakness. Unclear etiology. Extensive workup did not reveal any cause   Differential diagnosis include malignancy, dementia and? Psychiatric illness-per family, patient "does situps constantly nonintentionally". Followup records from PCP. May consider psychiatric input if medical workup was negative.   Frequent falls  Likely secondary to generalized weakness and deconditioning.  PT OT evaluation. May require SNF for rehabilitation.     Code Status:  Full code. Family Communication: son Mr. Urho, [place]. [name] daughter and HCPOA Disposition Plan: SNF   Consultants:  None  Procedures:  Foley 12/27 >  Antibiotics:  IV Rocephin 12/27>>12/28  Po Flagyl 12/28- 12/29  Vanco po 12/29   HPI/Subjective: Slow to respond, c/o some abdominal pain   Objective: Filed Vitals:   08/06/12 0515 08/06/12 1410 08/06/12 2134 08/07/12 0606  BP: 133/70 100/66 115/66 133/73  Pulse: 102 100 93 101  Temp:  98.1 F (36.7 C) 98.1 F (36.7 C) 97.9 F (36.6 C)  TempSrc:  Oral Oral Oral  Resp: 18 18 20 18   Height:      Weight:    49.669 kg (109 lb 8 oz)  SpO2:  95% 96% 96%   Patient Vitals for the past 24 hrs:  BP Temp Temp src Pulse Resp SpO2 Weight  08/07/12 0606 133/73 mmHg 97.9 F (36.6 C) Oral 101  18  96 % 49.669 kg (109 lb 8 oz)  08/06/12 2134 115/66 mmHg 98.1 F (36.7 C) Oral 93  20  96 % -  08/06/12 1410 100/66 mmHg 98.1 F (36.7 C) Oral 100  18  95 % -     Intake/Output Summary (Last 24 hours) at 08/07/12 0816 Last data filed at 08/07/12  1610  Gross per 24 hour  Intake   1200 ml  Output   4575 ml  Net  -3375 ml   Filed Weights   08/05/12 2042 08/06/12 0500 08/07/12 0606  Weight: 49.896 kg (110 lb) 49.896 kg (110 lb) 49.669 kg (109 lb 8 oz)    Exam:   Alert, following commands .  Cvs: rrr  Rs: ctab   Abdomen non distended minimal tenderness, hyperactive bowel sounds   Data Reviewed: Basic Metabolic Panel:  Lab 08/07/12 9604  08/06/12 0409 08/05/12 1837 08/05/12 1049  NA 138 136 -- 133*  K 3.8 3.7 -- 4.0  CL 102 103 -- 96  CO2 28 28 -- 27  GLUCOSE 103* 110* -- 116*  BUN 13 17 -- 19  CREATININE 0.85 0.94 0.86 0.93  CALCIUM 8.7 8.5 -- 9.7  MG -- -- 2.1 --  PHOS -- -- 2.7 --   Liver Function Tests:  Lab 08/05/12 1049  AST 20  ALT 26  ALKPHOS 80  BILITOT 0.7  PROT 7.0  ALBUMIN 3.4*   No results found for this basename: LIPASE:5,AMYLASE:5 in the last 168 hours No results found for this basename: AMMONIA:5 in the last 168 hours CBC:  Lab 08/07/12 0411 08/06/12 0409 08/05/12 1837 08/05/12 1049  WBC 11.3* 13.7* 15.6* 13.6*  NEUTROABS -- -- -- --  HGB 13.6 12.6* 14.0 15.7  HCT 40.6 38.0* 41.6 44.7  MCV 87.3 87.4 86.3 84.7  PLT 177 185 204 222   Cardiac Enzymes:  Lab 08/05/12 1049  CKTOTAL --  CKMB --  CKMBINDEX --  TROPONINI <0.30   BNP (last 3 results) No results found for this basename: PROBNP:3 in the last 8760 hours CBG: No results found for this basename: GLUCAP:5 in the last 168 hours  Recent Results (from the past 240 hour(s))  CLOSTRIDIUM DIFFICILE BY PCR     Status: Abnormal   Collection Time   08/05/12  8:00 PM      Component Value Range Status Comment   C difficile by pcr POSITIVE (*) NEGATIVE Final      Studies: Dg Chest 2 View  08/05/2012  *RADIOLOGY REPORT*  Clinical Data: Weakness, failure to thrive.  CHEST - 2 VIEW  Comparison: None.  Findings: Heart is normal size.  Lungs are clear.  No effusions. No acute bony abnormality.  IMPRESSION: No acute cardiopulmonary disease.   Original Report Authenticated By: Charlett Nose, M.D.    Ct Head Wo Contrast  08/05/2012  *RADIOLOGY REPORT*  Clinical Data: Weakness, failure to thrive.  CT HEAD WITHOUT CONTRAST  Technique:  Contiguous axial images were obtained from the base of the skull through the vertex without contrast.  Comparison: None.  Findings: Atherosclerotic and physiologic intracranial calcifications. Diffuse parenchymal  atrophy. Patchy areas of hypoattenuation in deep and periventricular white matter bilaterally. Negative for acute intracranial hemorrhage, mass lesion, acute infarction, midline shift, or mass-effect. Acute infarct may be inapparent on noncontrast CT. Ventricles and sulci symmetric. Bone windows demonstrate no focal lesion.  IMPRESSION:  1. Negative for bleed or other acute intracranial process.  2. Atrophy and nonspecific white matter changes   Original Report Authenticated By: D. Andria Rhein, MD    Dg Abd Portable 1v  08/06/2012  *RADIOLOGY REPORT*  Clinical Data: Abdominal pain.  Diarrhea.  PORTABLE ABDOMEN - 1 VIEW  Comparison: 02/19/2010  Findings: No evidence of dilated bowel loops or abnormal mass effect.  Two radiodensities are again seen in the right abdomen, consistent with renal calculi.  Largest measures 7 mm in diameter.  IMPRESSION:  1.  Normal bowel gas pattern. 2.  Stable right nephrolithiasis.   Original Report Authenticated By: Myles Rosenthal, M.D.     Scheduled Meds:    . antiseptic oral rinse  15 mL Mouth Rinse BID  . enoxaparin (LOVENOX) injection  40 mg Subcutaneous Q24H  . feeding supplement  237 mL Oral TID WC  . finasteride  5 mg Oral Daily  . metroNIDAZOLE  500 mg Oral Q8H  . Tamsulosin HCl  0.8 mg Oral Daily   Continuous Infusions:    . sodium chloride 100 mL/hr at 08/06/12 1846    Principal Problem:  *Failure to thrive Active Problems:  UTI (lower urinary tract infection)  Altered mental status  Leukocytosis  Urinary retention  Diarrhea  Dehydration  C. difficile colitis  Malnutrition  Other lab data  Magnesium 2.1, phosphorus 2.7.  Vitamin B 12: 447.  A.m. cortisol: 15.6.  TSH 0.690.  PSA: 0.78.  RPR nonreactive.  HIV nonreactive  Stool culture, ova and parasites  Urine culture: Pending  FOBT: Negative    Alezander Dimaano  [facility] Pager [phone]. If 8PM-8AM, please contact night-coverage at [email or web address], password  [facility] 08/07/2012, 8:16 AM  LOS: 2 days

## 2012-08-07 NOTE — Progress Notes (Signed)
INITIAL ADULT NUTRITION ASSESSMENT Date: 08/07/2012   Time: 10:34 AM  Reason for Assessment: Consult, Poor PO intake  INTERVENTION: 1. Continue current diet and supplements. Encourage adequate intake.  2. Patient's family advised on ways to increase calories and protein in diet to prevent weight loss.    ASSESSMENT: [name]  Dx: Failure to thrive  Hx:  Past Medical History  Diagnosis Date  . Bladder troubles   . Gout     Related Meds:     . antiseptic oral rinse  15 mL Mouth Rinse BID  . enoxaparin (LOVENOX) injection  40 mg Subcutaneous Q24H  . feeding supplement  237 mL Oral TID WC  . finasteride  5 mg Oral Daily  . metroNIDAZOLE  500 mg Oral Q8H  . Tamsulosin HCl  0.8 mg Oral Daily    Ht: 5\' 5"  (165.1 cm)  Wt: 109 lb 8 oz (49.669 kg)  Ideal Wt: 61.8 kg % Ideal Wt: 80%  Usual Wt: 61.4 kg % Usual Wt: 81%  Body mass index is 18.22 kg/(m^2). Patient is underweight.   Food/Nutrition Related Hx: 25 pound weight loss over 4 months. FTT.   Labs:  CMP     Component Value Date/Time   NA 138 08/07/2012 0411   K 3.8 08/07/2012 0411   CL 102 08/07/2012 0411   CO2 28 08/07/2012 0411   GLUCOSE 103* 08/07/2012 0411   BUN 13 08/07/2012 0411   CREATININE 0.85 08/07/2012 0411   CALCIUM 8.7 08/07/2012 0411   PROT 7.0 08/05/2012 1049   ALBUMIN 3.4* 08/05/2012 1049   AST 20 08/05/2012 1049   ALT 26 08/05/2012 1049   ALKPHOS 80 08/05/2012 1049   BILITOT 0.7 08/05/2012 1049   GFRNONAA 87* 08/07/2012 0411   GFRAA >90 08/07/2012 0411    Intake/Output Summary (Last 24 hours) at 08/07/12 1037 Last data filed at 08/07/12 8657  Gross per 24 hour  Intake    960 ml  Output   3375 ml  Net  -2415 ml    Diet Order: Regular, 25-100% intake  Supplements/Tube Feeding: Ensure Complete TID  IVF:    Estimated Nutritional Needs:   Kcal: 1675-1800 kcal Protein: 60-70 g Fluid: 1.5 L  Patient with recent history of poor appetite, not eating well over the last 2  weeks. He lives with family members. He reports that he is now eating better, with 100% of breakfast eaten. He is getting Ensure Complete TID and drinking 100%. His family reports that they give this to him at home. Weight loss of 19% over 4 months, fat loss at the temple and tricep, and muscle loss at the shoulder meet the criteria for severe malnutrition in the context of chronic illness.   NUTRITION DIAGNOSIS: -Malnutrition (NI-5.2).  Status: Ongoing  RELATED TO: decreased appetite, FTT  AS EVIDENCE BY: 19% weight loss in 4 months and fat/muscle tissue loss  MONITORING/EVALUATION(Goals): Patient will meet 90-100% of estimated nutrition needs with meals and supplements to prevent further weight loss.   Monitor: PO intake of meals and snacks, weight, labs, I/O's  EDUCATION NEEDS: -No education needs identified at this time  Brian Salinas, RD, LDN Pager #: [phone] After-Hours Pager #: [phone]  DOCUMENTATION CODES Per approved criteria  -Severe malnutrition in the context of chronic illness -Underweight    Brian Salinas 08/07/2012, 10:34 AM

## 2012-08-07 NOTE — Progress Notes (Signed)
Clinical Social Work Department BRIEF PSYCHOSOCIAL ASSESSMENT 08/07/2012  Patient:  BENNO, BRENSINGER     Account Number:  [id number]     Admit date:  08/05/2012  Clinical Social Worker:  Leron Croak, CLINICAL SOCIAL WORKER  Date/Time:  08/07/2012 03:03 PM  Referred by:  Physician  Date Referred:  08/07/2012 Referred for  SNF Placement   Other Referral:   Interview type:  Family Other interview type:   CSW spoke with Loraine Leriche and Landis Gandy    PSYCHOSOCIAL DATA Living Status:  FAMILY Admitted from facility:   Level of care:   Primary support name:  Marice Angelino Primary support relationship to patient:  CHILD, ADULT Degree of support available:   Pt has good support system from family and friends    CURRENT CONCERNS Current Concerns  Post-Acute Placement   Other Concerns:    SOCIAL WORK ASSESSMENT / PLAN CSW met with both son's in the hall to discuss SNF placement. Family stated they would prefer the Pt to be at [facility] as it is close to their residence. Pt had many family members at the bedside and was interacting with the family. Family gave permission to do SNF search in all of [place] and will discuss choices after they have more information about bed offers.   Assessment/plan status:  Information/Referral to [facility] Other assessment/ plan:   Information/referral to community resources:   CSW provided a SNF listing for the Pt sons and they will be discusing bed choices.    PATIENT'S/FAMILY'S RESPONSE TO PLAN OF CARE: Pt's family was appreciative for assistance with SNF search and d/c planning.     Leron Croak, LCSWA [employer] [phone]

## 2012-08-07 NOTE — Progress Notes (Addendum)
Clinical Social Work Department CLINICAL SOCIAL WORK PLACEMENT NOTE 08/07/2012  Patient:  Brian Salinas, Brian Salinas  Account Number:  [id number] Admit date:  08/05/2012  Clinical Social Worker:  Leron Croak, CLINICAL SOCIAL WORKER  Date/time:  08/07/2012 03:23 PM  Clinical Social Work is seeking post-discharge placement for this patient at the following level of care:   SKILLED NURSING   (*CSW will update this form in Epic as items are completed)   08/07/2012  Patient/family provided with [facility] Department of Clinical Social Work's list of facilities offering this level of care within the geographic area requested by the patient (or if unable, by the patient's family).  08/07/2012  Patient/family informed of their freedom to choose among providers that offer the needed level of care, that participate in Medicare, Medicaid or managed care program needed by the patient, have an available bed and are willing to accept the patient.  08/07/2012  Patient/family informed of [facility]' ownership interest in [facility], as well as of the fact that they are under no obligation to receive care at this facility.  PASARR submitted to EDS on 07/15/2012 PASARR number received from EDS on 07/15/2012  FL2 transmitted to all facilities in geographic area requested by pt/family on  08/07/2012 FL2 transmitted to all facilities within larger geographic area on 08/07/2012  Patient informed that his/her managed care company has contracts with or will negotiate with  certain facilities, including the following:     Patient/family informed of bed offers received:  08/08/2012 Patient chooses bed at [facility] Physician recommends and patient chooses bed at    Patient to be transferred to  on  [facility] on 08/11/2012 Patient to be transferred to facility by ambulance Sharin Mons)  The following physician request were entered in Epic:   Additional  Comments:  Jacklynn Lewis, MSW, LCSWA  Clinical Social Work [phone]

## 2012-08-08 DIAGNOSIS — F329 Major depressive disorder, single episode, unspecified: Secondary | ICD-10-CM | POA: Diagnosis present

## 2012-08-08 DIAGNOSIS — F32A Depression, unspecified: Secondary | ICD-10-CM | POA: Diagnosis present

## 2012-08-08 LAB — OVA AND PARASITE EXAMINATION: Ova and parasites: NONE SEEN

## 2012-08-08 MED ORDER — SERTRALINE HCL 25 MG PO TABS
25.0000 mg | ORAL_TABLET | Freq: Every day | ORAL | Status: DC
  Filled 2012-08-08: qty 1

## 2012-08-08 MED ORDER — MIRTAZAPINE 30 MG PO TABS
30.0000 mg | ORAL_TABLET | Freq: Every day | ORAL | Status: DC
  Administered 2012-08-08 – 2012-08-10 (×3): 30 mg via ORAL
  Filled 2012-08-08 (×4): qty 1

## 2012-08-08 NOTE — Progress Notes (Signed)
Clinical Social Worker met with pt and pt family at bedside and provided SNF bed offers. Clinical Social Worker clarified pt family questions. Pt family interested in [facility], which Clinical Social Worker explained does not currently have bed availability and may not be in contract with pt insurance, and [facility], [employer], and [place]. Clinical Social Worker encouraged family to visit facilities and notify this Visual merchandiser of decision. Clinical Social Worker to facilitate pt discharge needs when pt medically stable for discharge.  Jacklynn Lewis, MSW, LCSWA  Clinical Social Work [phone]

## 2012-08-08 NOTE — Care Management Note (Addendum)
    Page 1 of 1   08/09/2012     1:44:55 PM   CARE MANAGEMENT NOTE 08/09/2012  Patient:  Brian Salinas, Brian Salinas   Account Number:  [id number]  Date Initiated:  08/08/2012  Documentation initiated by:  Lorenda Ishihara  Subjective/Objective Assessment:   71 yo male admitted with cdiff colitis, UTI, failure to thrive. PTA lived at home with son and [facility] services     Action/Plan:   Plan for SNF at d/c   Anticipated DC Date:  08/11/2012   Anticipated DC Plan:  SKILLED NURSING FACILITY  In-house referral  Clinical Social Worker      DC Planning Services  CM consult      Choice offered to / List presented to:             Status of service:  Completed, signed off Medicare Important Message given?   (If response is "NO", the following Medicare IM given date fields will be blank) Date Medicare IM given:   Date Additional Medicare IM given:    Discharge Disposition:  SKILLED NURSING FACILITY  Per UR Regulation:  Reviewed for med. necessity/level of care/duration of stay  If discussed at Long Length of Stay Meetings, dates discussed:    Comments:

## 2012-08-08 NOTE — Progress Notes (Signed)
Physical Therapy Treatment Patient Details Name: Brian Salinas MRN: [id number] DOB: 04/17/1941 Today's Date: 08/08/2012 Time: 1410-1433 PT Time Calculation (min): 23 min  PT Assessment / Plan / Recommendation Comments on Treatment Session  pt progressing well; continues to be slow to process and is a fall risk, will benefit from continued PT at SNF    Follow Up Recommendations  SNF;Supervision/Assistance - 24 hour     Does the patient have the potential to tolerate intense rehabilitation     Barriers to Discharge        Equipment Recommendations  Rolling walker with 5" wheels    Recommendations for Other Services    Frequency Min 3X/week   Plan Discharge plan remains appropriate;Frequency remains appropriate    Precautions / Restrictions Precautions Precautions: Fall   Pertinent Vitals/Pain     Mobility  Bed Mobility Bed Mobility: Rolling Left;Left Sidelying to Sit;Sit to Sidelying Left;Sitting - Scoot to Edge of Bed Rolling Left: 4: Min guard Left Sidelying to Sit: 3: Mod assist Sitting - Scoot to Edge of Bed: 4: Min assist Sit to Sidelying Left: 3: Mod assist;HOB flat Details for Bed Mobility Assistance: min to facilitate; pt rigid Transfers Transfers: Sit to Stand;Stand to Sit Sit to Stand: 4: Min assist Stand to Sit: 4: Min assist Details for Transfer Assistance: cues to flex knees bil,  assist wtih anterior, superior wt shift, and trunk extension; requires tactile cues and manual facilitation throughout Ambulation/Gait Ambulation/Gait Assistance: 4: Min assist;3: Mod assist Ambulation Distance (Feet): 60 Feet Assistive device: Rolling walker Ambulation/Gait Assistance Details: Assist to steady throughout, RW direction, with max cues for wider BOS, upright posture and increased stride length.  Gait Pattern: Decreased stride length;Shuffle;Step-through pattern;Narrow base of support Gait velocity: decreased General Gait Details: gait deviations improved with  increased speed and cues    Exercises Total Joint Exercises Quad Sets: AROM;Both;10 reps Heel Slides: AROM;AAROM;Both;10 reps Straight Leg Raises: AROM;AAROM;Both;10 reps Long Arc Quad: AROM;Both;10 reps   PT Diagnosis:    PT Problem List:   PT Treatment Interventions:     PT Goals Acute Rehab PT Goals Time For Goal Achievement: 08/21/12 Potential to Achieve Goals: Good Pt will go Supine/Side to Sit: with supervision PT Goal: Supine/Side to Sit - Progress: Progressing toward goal Pt will go Sit to Supine/Side: with supervision PT Goal: Sit to Supine/Side - Progress: Progressing toward goal Pt will go Sit to Stand: with supervision PT Goal: Sit to Stand - Progress: Progressing toward goal Pt will Ambulate: 51 - 150 feet;with supervision;with least restrictive assistive device PT Goal: Ambulate - Progress: Progressing toward goal Pt will Perform Home Exercise Program: with supervision, verbal cues required/provided PT Goal: Perform Home Exercise Program - Progress: Progressing toward goal  Visit Information  Last PT Received On: 08/08/12 Assistance Needed: +1    Subjective Data  Subjective: ok   Cognition  Overall Cognitive Status: Impaired Area of Impairment: Awareness of errors;Awareness of deficits;Memory Arousal/Alertness: Awake/alert Behavior During Session: [facility] for tasks performed Current Attention Level: Sustained Memory: Decreased recall of precautions Following Commands: Follows one step commands consistently Safety/Judgement: Decreased awareness of safety precautions;Decreased awareness of need for assistance Awareness of Errors: Assistance required to identify errors made;Assistance required to correct errors made    Balance     End of Session PT - End of Session Equipment Utilized During Treatment: Gait belt Activity Tolerance: Patient tolerated treatment well Patient left: in bed;with call bell/phone within reach;with bed alarm set   GP  [facility] 08/08/2012, 2:44 PM

## 2012-08-08 NOTE — Progress Notes (Signed)
[facility] PROGRESS NOTE  Brian Salinas VHQ:[id number] DOB: [birth year] DOA: 08/05/2012 PCP: Sid Falcon, MD  Primary Urologist: Dr. Lorin Picket McDiarmid, [facility].  Brief narrative 71 year old male with history of possible BPH, gout, seen by his primary urologist 3 weeks ago, admitted on 12/27 with progressive weakness, inability to walk, unable to perform ADLs, poor oral intake, some confusion, diarrhea, lightheadedness and a fall, dysuria and difficulty passing urine. Per family, he has been gradually declining over the last 4 months but was still able to perform his ADLs and was doing relatively okay until 3 weeks ago. He has rapidly declined in the last 3-4 days. In the ED, chest x-ray and CT head negative, white blood cell 13.6 and UA positive for nitrites, few bacteria and 0-2 white blood cells. He was admitted for further evaluation and management  Assessment/Plan:  C. difficile colitis - moderate  Started oral Flagyl on 08/06/12  On contact isolation.  Apparently had a colonoscopy a couple of years ago which was negative. Cannot recollect who did it and when exactly.  Switched to po vancomycin 08/07/12 due to patient having abdominal pain and feeling poorly.   Possible UTI/urinary retention/BPH.  Foley catheter was placed and patient will have voiding trials after rehab in the urologist office  UA not very impressive for UTI and given diarrhea from C. difficile colitis we suspect bacteriuria is asymptomatic - patient did receive iv Rocephin on 08/05/12   Outpatient followup with primary urologist for voiding trials after successful snf rehab . Continued on  finasteride and tamsulosin  Dehydration  Secondary to poor oral intake.  Patient did receive iv fluids from admission until 08/07/12   Malnutrition  nutrition consultation.   Failure to thrive/altered mental status   Patient has been declining over the last couple of months with progressive  weight loss, decreased oral intake and generalized weakness. Unclear etiology. Extensive workup did not reveal any cause   Differential diagnosis include malignancy, dementia and? Psychiatric illness-per family, patient "does situps constantly nonintentionally". Followup records from PCP. May consider psychiatric input if medical workup was negative.   Suspect severe depression - plan to start remeron daily to help with appetite  too   Frequent falls  Likely secondary to generalized weakness and deconditioning.  PT OT evaluation. May require SNF for rehabilitation.     Code Status:  Full code. Family Communication: son Mr. Brian Salinas, Brian Salinas. Brian Salinas daughter and HCPOA Disposition Plan: SNF   Consultants:  None  Procedures:  Foley 12/27 >  Antibiotics:  IV Rocephin 12/27>>12/28  Po Flagyl 12/28- 12/29  Vanco po 12/29   HPI/Subjective: Sleeping most of the day, seems depressed   Objective: Filed Vitals:   08/07/12 1313 08/07/12 2311 08/08/12 0534 08/08/12 0627  BP: 107/66 119/80 124/81   Pulse: 99 99 95   Temp: 97.5 F (36.4 C) 98.2 F (36.8 C) 97.9 F (36.6 C)   TempSrc: Oral Oral Oral   Resp: 16 18 20    Height:      Weight:    49.17 kg (108 lb 6.4 oz)  SpO2: 95% 96% 98%    Patient Vitals for the past 24 hrs:  BP Temp Temp src Pulse Resp SpO2 Weight  08/08/12 0627 - - - - - - 49.17 kg (108 lb 6.4 oz)  08/08/12 0534 124/81 mmHg 97.9 F (36.6 C) Oral 95  20  98 % -  08/07/12 2311 119/80 mmHg 98.2 F (36.8 C) Oral 99  18  96 % -  08/07/12 1313 107/66 mmHg 97.5 F (36.4 C) Oral 99  16  95 % -     Intake/Output Summary (Last 24 hours) at 08/08/12 1114 Last data filed at 08/08/12 0851  Gross per 24 hour  Intake   1320 ml  Output   3300 ml  Net  -1980 ml   Filed Weights   08/06/12 0500 08/07/12 0606 08/08/12 0627  Weight: 49.896 kg (110 lb) 49.669 kg (109 lb 8 oz) 49.17 kg (108 lb 6.4 oz)    Exam:   Will wake up , following  commands , oriented x2.  Cvs: rrr, no m,r,g   Rs: ctab , no w.rc,   Abdomen non distended minimal tenderness, hyperactive bowel sounds   Data Reviewed: Basic Metabolic Panel:  Lab 08/07/12 4782 08/06/12 0409 08/05/12 1837 08/05/12 1049  NA 138 136 -- 133*  K 3.8 3.7 -- 4.0  CL 102 103 -- 96  CO2 28 28 -- 27  GLUCOSE 103* 110* -- 116*  BUN 13 17 -- 19  CREATININE 0.85 0.94 0.86 0.93  CALCIUM 8.7 8.5 -- 9.7  MG -- -- 2.1 --  PHOS -- -- 2.7 --   Liver Function Tests:  Lab 08/05/12 1049  AST 20  ALT 26  ALKPHOS 80  BILITOT 0.7  PROT 7.0  ALBUMIN 3.4*   No results found for this basename: LIPASE:5,AMYLASE:5 in the last 168 hours No results found for this basename: AMMONIA:5 in the last 168 hours CBC:  Lab 08/07/12 0411 08/06/12 0409 08/05/12 1837 08/05/12 1049  WBC 11.3* 13.7* 15.6* 13.6*  NEUTROABS -- -- -- --  HGB 13.6 12.6* 14.0 15.7  HCT 40.6 38.0* 41.6 44.7  MCV 87.3 87.4 86.3 84.7  PLT 177 185 204 222   Cardiac Enzymes:  Lab 08/05/12 1049  CKTOTAL --  CKMB --  CKMBINDEX --  TROPONINI <0.30   BNP (last 3 results) No results found for this basename: PROBNP:3 in the last 8760 hours CBG: No results found for this basename: GLUCAP:5 in the last 168 hours  Recent Results (from the past 240 hour(s))  URINE CULTURE     Status: Normal   Collection Time   08/05/12  6:30 PM      Component Value Range Status Comment   Specimen Description URINE, CATHETERIZED   Final    Special Requests Immunocompromised   Final    Culture  Setup Time 08/06/2012 01:56   Final    Colony Count NO GROWTH   Final    Culture NO GROWTH   Final    Report Status 08/07/2012 FINAL   Final   CLOSTRIDIUM DIFFICILE BY PCR     Status: Abnormal   Collection Time   08/05/12  8:00 PM      Component Value Range Status Comment   C difficile by pcr POSITIVE (*) NEGATIVE Final   STOOL CULTURE     Status: Normal (Preliminary result)   Collection Time   08/05/12  8:00 PM      Component Value  Range Status Comment   Specimen Description STOOL   Final    Special Requests Immunocompromised   Final    Culture NO GROWTH 1 DAY   Final    Report Status PENDING   Incomplete   OVA AND PARASITE EXAMINATION     Status: Normal   Collection Time   08/05/12  8:44 PM      Component Value Range Status Comment   Specimen Description STOOL   Final  Special Requests Immunocompromised   Final    Ova and parasites NO OVA OR PARASITES SEEN   Final    Report Status 08/08/2012 FINAL   Final      Studies: Dg Abd Portable 1v  08/06/2012  *RADIOLOGY REPORT*  Clinical Data: Abdominal pain.  Diarrhea.  PORTABLE ABDOMEN - 1 VIEW  Comparison: 02/19/2010  Findings: No evidence of dilated bowel loops or abnormal mass effect.  Two radiodensities are again seen in the right abdomen, consistent with renal calculi.  Largest measures 7 mm in diameter.  IMPRESSION:  1.  Normal bowel gas pattern. 2.  Stable right nephrolithiasis.   Original Report Authenticated By: Myles Rosenthal, M.D.     Scheduled Meds:    . antiseptic oral rinse  15 mL Mouth Rinse BID  . enoxaparin (LOVENOX) injection  40 mg Subcutaneous Q24H  . feeding supplement  237 mL Oral TID WC  . finasteride  5 mg Oral Daily  . mirtazapine  30 mg Oral QHS  . Tamsulosin HCl  0.8 mg Oral Daily  . vancomycin  125 mg Oral Q6H   Continuous Infusions:    Principal Problem:  *Failure to thrive Active Problems:  UTI (lower urinary tract infection)  Altered mental status  Leukocytosis  Urinary retention  Diarrhea  Dehydration  C. difficile colitis  Malnutrition  Other lab data  Magnesium 2.1, phosphorus 2.7.  Vitamin B 12: 447.  A.m. cortisol: 15.6.  TSH 0.690.  PSA: 0.78.  RPR nonreactive.  HIV nonreactive  Stool culture, ova and parasites  Urine culture: Pending  FOBT: Negative    Milea Klink  [facility] Pager [phone]. If 8PM-8AM, please contact night-coverage at [email or web address], password [facility] 08/08/2012, 11:14 AM   LOS: 3 days

## 2012-08-09 DIAGNOSIS — E43 Unspecified severe protein-calorie malnutrition: Secondary | ICD-10-CM | POA: Diagnosis present

## 2012-08-09 LAB — OCCULT BLOOD X 1 CARD TO LAB, STOOL: Fecal Occult Bld: NEGATIVE

## 2012-08-09 LAB — CBC
HCT: 47.2 % (ref 39.0–52.0)
Hemoglobin: 15.9 g/dL (ref 13.0–17.0)
MCH: 29.3 pg (ref 26.0–34.0)
MCHC: 33.7 g/dL (ref 30.0–36.0)
MCV: 86.9 fL (ref 78.0–100.0)
Platelets: 202 K/uL (ref 150–400)
RBC: 5.43 MIL/uL (ref 4.22–5.81)
RDW: 12.7 % (ref 11.5–15.5)
WBC: 13.7 K/uL — ABNORMAL HIGH (ref 4.0–10.5)

## 2012-08-09 LAB — STOOL CULTURE

## 2012-08-09 MED ORDER — COLCHICINE 0.6 MG PO TABS
0.6000 mg | ORAL_TABLET | Freq: Three times a day (TID) | ORAL | Status: DC
  Administered 2012-08-09 – 2012-08-11 (×6): 0.6 mg via ORAL
  Filled 2012-08-09 (×9): qty 1

## 2012-08-09 NOTE — Progress Notes (Signed)
Clinical Social Worker spoke with pt daughter, Joellyn Haff via telephone [phone]) in regard to decision for SNF. Pt family first choice is [facility] and second choice is [facility]. Clinical Social Worker had spoken to [employer] yesterday who wanted more information about pt CDIFF symptoms. Clinical Social Worker got information from Char[facility] fundraiser and contacted [facility] and left message. Clinical Social Worker will discuss with Joetta Manners to make sure that pt has appropriate room at facility. Clinical Social Worker to update pt family. Per MD, anticipate pt will be medically stable for discharge on Thursday.  Jacklynn Lewis, MSW, LCSWA  Clinical Social Work [phone]

## 2012-08-09 NOTE — Progress Notes (Signed)
Pt got up to chair for about one hour this morning, and then requested to go back to bed for a nap. Will try to get patient up to chair again later this afternoon.

## 2012-08-09 NOTE — Progress Notes (Signed)
Physical Therapy Treatment Patient Details Name: Brian Salinas MRN: [id number] DOB: Jan 27, 1941 Today's Date: 08/09/2012 Time: 1340-1405 PT Time Calculation (min): 25 min  PT Assessment / Plan / Recommendation Comments on Treatment Session  Amb pt twice.  Brother assisted by following with the chair. High Fall Risk.     Follow Up Recommendations  SNF     Does the patient have the potential to tolerate intense rehabilitation     Barriers to Discharge        Equipment Recommendations  Rolling walker with 5" wheels    Recommendations for Other Services    Frequency Min 3X/week   Plan Discharge plan remains appropriate    Precautions / Restrictions Precautions Precautions: Fall Precaution Comments: rigid throughout Restrictions Weight Bearing Restrictions: No   Pertinent Vitals/Pain No c/o pain    Mobility  Bed Mobility Bed Mobility: Supine to Sit;Sitting - Scoot to Edge of Bed Supine to Sit: 2: Max assist Sitting - Scoot to [place] of Bed: 2: Max assist Details for Bed Mobility Assistance: very rigid throughout and was unable to use B UE's to push self up so assisted by pulling pt up.  Once upright, pt required MOD assist for sitting balance with difficulty finding midline.  Transfers Transfers: Sit to Stand;Stand to Sit Sit to Stand: 3: Mod assist;From bed Stand to Sit: 3: Mod assist;To chair/3-in-1 Details for Transfer Assistance: 75% VC's to flex hips/knees and use B UE's to assist with both sit to stand and stand to sit. Again, rigid throughout and slow to move. Ambulation/Gait Ambulation/Gait Assistance: 3: Mod assist Ambulation Distance (Feet): 120 Feet (60' x 2) Assistive device: Rolling walker Ambulation/Gait Assistance Details: Assist to steady throughout, RW direction, with max cues for wider BOS, upright posture and increased stride length.  Posterior lean and assistance to advance RW. Gait Pattern: Step-to pattern;Step-through pattern;Shuffle;Decreased step  length - right;Decreased step length - left;Narrow base of support Gait velocity: decreased     PT Goals                                      progressing    Visit Information  Last PT Received On: 08/09/12 Assistance Needed: +1    Subjective Data      Cognition       Balance   poor  End of Session PT - End of Session Equipment Utilized During Treatment: Gait belt Activity Tolerance: Patient limited by fatigue Patient left: in chair;with call bell/phone within reach;with family/visitor present   Felecia Shelling  PTA [facility]  Acute  Rehab Pager     [phone]

## 2012-08-09 NOTE — Progress Notes (Signed)
[facility] PROGRESS NOTE  MCGUIRE GASPARYAN ZOX:[id number] DOB: 11-Dec-1940 DOA: 08/05/2012 PCP: Sid Falcon, MD  Primary Urologist: Dr. Lorin Picket McDiarmid, [facility].  Brief narrative 71 year old male with history of possible BPH, gout, seen by his primary urologist 3 weeks ago, admitted on 12/27 with progressive weakness, inability to walk, unable to perform ADLs, poor oral intake, some confusion, diarrhea, lightheadedness and a fall, dysuria and difficulty passing urine. Per family, he has been gradually declining over the last 4 months but was still able to perform his ADLs and was doing relatively okay until 3 weeks ago. He has rapidly declined in the last 3-4 days. In the ED, chest x-ray and CT head negative, white blood cell 13.6 and UA positive for nitrites, few bacteria and 0-2 white blood cells. He was admitted for further evaluation and management  Assessment/Plan:  - New leukocytosis 08/09/12  - ddx UTI vs gout attack - plan for urine culture and empiric colchicine   C. difficile colitis - moderate  Started oral Flagyl on 08/06/12  On contact isolation.  Apparently had a colonoscopy a couple of years ago which was negative. Cannot recollect who did it and when exactly.  Switched to po vancomycin 08/07/12 due to patient having abdominal pain and feeling poorly.   Possible UTI/urinary retention/BPH.  Foley catheter was placed and patient will have voiding trials after rehab in the urologist office  UA not very impressive for UTI and given diarrhea from C. difficile colitis we suspect bacteriuria is asymptomatic - patient did receive iv Rocephin on 08/05/12   Outpatient followup with primary urologist for voiding trials after successful snf rehab . Continued on  finasteride and tamsulosin  Dehydration  Secondary to poor oral intake.  Patient did receive iv fluids from admission until 08/07/12   Severe Protein Caloric Malnutrition  nutrition  consultation and started supplements   Failure to thrive/altered mental status   Patient has been declining over the last couple of months with progressive weight loss, decreased oral intake and generalized weakness. Unclear etiology. Extensive workup did not reveal any cause   Differential diagnosis include malignancy, dementia and? Psychiatric illness-per family, patient "does situps constantly nonintentionally".  Suspect severe depression - started remeron daily on 08/08/12 to help with appetite  too   Frequent falls  Likely secondary to generalized weakness and deconditioning.  PT OT evaluation. May require SNF for rehabilitation.     Code Status:  Full code. Family Communication: son Mr. Costa, Jha. [name] daughter and HCPOA Disposition Plan: SNF   Consultants:  None  Procedures:  Foley 12/27 >  Antibiotics:  IV Rocephin 12/27>>12/28  Po Flagyl 12/28- 12/29  Vanco po 12/29   HPI/Subjective: C/o gout pain   Objective: Filed Vitals:   08/08/12 0627 08/08/12 1428 08/08/12 2012 08/09/12 0543  BP:  121/68 127/81 105/73  Pulse:  102 107 103  Temp:  98 F (36.7 C) 98.3 F (36.8 C) 97.4 F (36.3 C)  TempSrc:  Oral Oral Oral  Resp:  20 18 16   Height:      Weight: 49.17 kg (108 lb 6.4 oz)     SpO2:  96% 96% 97%   Patient Vitals for the past 24 hrs:  BP Temp Temp src Pulse Resp SpO2  08/09/12 0543 105/73 mmHg 97.4 F (36.3 C) Oral 103  16  97 %  08/08/12 2012 127/81 mmHg 98.3 F (36.8 C) Oral 107  18  96 %  08/08/12 1428 121/68 mmHg 98 F (36.7  C) Oral 102  20  96 %     Intake/Output Summary (Last 24 hours) at 08/09/12 1136 Last data filed at 08/09/12 0906  Gross per 24 hour  Intake   1020 ml  Output   1925 ml  Net   -905 ml   Filed Weights   08/06/12 0500 08/07/12 0606 08/08/12 0627  Weight: 49.896 kg (110 lb) 49.669 kg (109 lb 8 oz) 49.17 kg (108 lb 6.4 oz)    Exam:   Will wake up , following commands , oriented  x2.  Cvs: rrr, no m,r,g   Rs: ctab , no w.rc,   Abdomen non distended minimal tenderness, hyperactive bowel sounds   Tender 1st metatarso phalangeal joint   Data Reviewed: Basic Metabolic Panel:  Lab 08/07/12 1610 08/06/12 0409 08/05/12 1837 08/05/12 1049  NA 138 136 -- 133*  K 3.8 3.7 -- 4.0  CL 102 103 -- 96  CO2 28 28 -- 27  GLUCOSE 103* 110* -- 116*  BUN 13 17 -- 19  CREATININE 0.85 0.94 0.86 0.93  CALCIUM 8.7 8.5 -- 9.7  MG -- -- 2.1 --  PHOS -- -- 2.7 --   Liver Function Tests:  Lab 08/05/12 1049  AST 20  ALT 26  ALKPHOS 80  BILITOT 0.7  PROT 7.0  ALBUMIN 3.4*   No results found for this basename: LIPASE:5,AMYLASE:5 in the last 168 hours No results found for this basename: AMMONIA:5 in the last 168 hours CBC:  Lab 08/09/12 0405 08/07/12 0411 08/06/12 0409 08/05/12 1837 08/05/12 1049  WBC 13.7* 11.3* 13.7* 15.6* 13.6*  NEUTROABS -- -- -- -- --  HGB 15.9 13.6 12.6* 14.0 15.7  HCT 47.2 40.6 38.0* 41.6 44.7  MCV 86.9 87.3 87.4 86.3 84.7  PLT 202 177 185 204 222   Cardiac Enzymes:  Lab 08/05/12 1049  CKTOTAL --  CKMB --  CKMBINDEX --  TROPONINI <0.30   BNP (last 3 results) No results found for this basename: PROBNP:3 in the last 8760 hours CBG: No results found for this basename: GLUCAP:5 in the last 168 hours  Recent Results (from the past 240 hour(s))  URINE CULTURE     Status: Normal   Collection Time   08/05/12  6:30 PM      Component Value Range Status Comment   Specimen Description URINE, CATHETERIZED   Final    Special Requests Immunocompromised   Final    Culture  Setup Time 08/06/2012 01:56   Final    Colony Count NO GROWTH   Final    Culture NO GROWTH   Final    Report Status 08/07/2012 FINAL   Final   CLOSTRIDIUM DIFFICILE BY PCR     Status: Abnormal   Collection Time   08/05/12  8:00 PM      Component Value Range Status Comment   C difficile by pcr POSITIVE (*) NEGATIVE Final   STOOL CULTURE     Status: Normal (Preliminary  result)   Collection Time   08/05/12  8:00 PM      Component Value Range Status Comment   Specimen Description STOOL   Final    Special Requests Immunocompromised   Final    Culture     Final    Value: NO SUSPICIOUS COLONIES, CONTINUING TO HOLD     Note: REDUCED NORMAL FLORA PRESENT   Report Status PENDING   Incomplete   OVA AND PARASITE EXAMINATION     Status: Normal   Collection Time  08/05/12  8:44 PM      Component Value Range Status Comment   Specimen Description STOOL   Final    Special Requests Immunocompromised   Final    Ova and parasites NO OVA OR PARASITES SEEN   Final    Report Status 08/08/2012 FINAL   Final      Studies: No results found.  Scheduled Meds:    . antiseptic oral rinse  15 mL Mouth Rinse BID  . colchicine  0.6 mg Oral TID  . enoxaparin (LOVENOX) injection  40 mg Subcutaneous Q24H  . feeding supplement  237 mL Oral TID WC  . finasteride  5 mg Oral Daily  . mirtazapine  30 mg Oral QHS  . Tamsulosin HCl  0.8 mg Oral Daily  . vancomycin  125 mg Oral Q6H   Continuous Infusions:    Principal Problem:  *C. difficile colitis Active Problems:  UTI (lower urinary tract infection)  Failure to thrive  Altered mental status  Leukocytosis  Urinary retention  Diarrhea  Dehydration  Malnutrition  Depression  Severe protein-calorie malnutrition  Other lab data  Magnesium 2.1, phosphorus 2.7.  Vitamin B 12: 447.  A.m. cortisol: 15.6.  TSH 0.690.  PSA: 0.78.  RPR nonreactive.  HIV nonreactive  Stool culture, ova and parasites  Urine culture: Pending  FOBT: Negative    Jaysten Essner  [facility] Pager [phone]. If 8PM-8AM, please contact night-coverage at [email or web address], password [facility] 08/09/2012, 11:36 AM  LOS: 4 days

## 2012-08-09 NOTE — Progress Notes (Signed)
Pt walked with PT today and then stayed in chair for an additional hour before asking to go back to bed. Pt was able to get up from chair to bed with 1 assist.

## 2012-08-09 NOTE — Clinical Documentation Improvement (Signed)
MALNUTRITION DOCUMENTATION CLARIFICATION  THIS DOCUMENT IS NOT A PERMANENT PART OF THE MEDICAL RECORD  TO RESPOND TO THE THIS QUERY, FOLLOW THE INSTRUCTIONS BELOW:  1. If needed, update documentation for the patient's encounter via the notes activity.  2. Access this query again and click edit on the In [employer].  3. After updating, or not, click F2 to complete all highlighted (required) fields concerning your review. Select "additional documentation in the medical record" OR "no additional documentation provided".  4. Click Sign note button.  5. The deficiency will fall out of your In Basket *Please let us know if you are not able to complete this workflow by phone or e-mail (listed below).  Please update your documentation within the medical record to reflect your response to this query.                                                                                        08/09/12   Dear Dr.S [facility],  In a better effort to capture your patient's severity of illness, reflect appropriate length of stay and utilization of resources, a review of the patient medical record has revealed the following indicators.    Based on your clinical judgment, please clarify and document in a progress note and/or discharge summary the clinical condition associated with the following supporting information:  In responding to this query please exercise your independent judgment.  The fact that a query is asked, does not imply that any particular answer is desired or expected. 08/07/12 Nutrition eval noted w/ nutrition dx of "-Severe malnutrition in the context of chronic illness  -Underweight". For accurate Dx specificity & severity noted noted "malnutrition" in prog notes be further specificied as noted by Nurtrition Assessment documentation. Thank you  Possible Clinical Conditions? . Mild Malnutrition  . Moderate Malnutrition . Severe Malnutrition   . Protein Calorie  Malnutrition . Severe Protein Calorie Malnutrition  . Emaciation  . Cachexia   Other Condition (please specify) Cannot Clinically Determine  Supporting Information: Risk Factors: See Nutrition Assessment 08/07/12  Signs & Symptoms: See Nutrition Assessment 08/07/12  -Diagnostics: See Nutrition Assessment 08/07/12  Treatments: See Nutrition Assessment 08/07/12  -Medications:  -Nutrition Consult: See Nutrition Assessment 08/07/12   You may use possible, probable, or suspect with inpatient documentation. possible, probable, suspected diagnoses MUST be documented at the time of discharge  Reviewed: additional documentation in the medical record  Thank You,  Toribio Harbour, RN, BSN, CCDS Certified Clinical Documentation Specialist Pager: [phone]  Health Information Management [place]

## 2012-08-10 LAB — BASIC METABOLIC PANEL
CO2: 31 mEq/L (ref 19–32)
Chloride: 96 mEq/L (ref 96–112)
Glucose, Bld: 113 mg/dL — ABNORMAL HIGH (ref 70–99)
Potassium: 4.1 mEq/L (ref 3.5–5.1)
Sodium: 134 mEq/L — ABNORMAL LOW (ref 135–145)

## 2012-08-10 LAB — CBC
HCT: 45.7 % (ref 39.0–52.0)
Hemoglobin: 15.2 g/dL (ref 13.0–17.0)
MCH: 29.2 pg (ref 26.0–34.0)
MCV: 87.7 fL (ref 78.0–100.0)
Platelets: 229 10*3/uL (ref 150–400)
RBC: 5.21 MIL/uL (ref 4.22–5.81)
WBC: 9.7 10*3/uL (ref 4.0–10.5)

## 2012-08-10 LAB — URINE CULTURE

## 2012-08-10 NOTE — Progress Notes (Signed)
[facility] PROGRESS NOTE  Brian Salinas ZOX:[id number] DOB: 08-06-1941 DOA: 08/05/2012 PCP: Sid Falcon, MD  Primary Urologist: Dr. Lorin Picket McDiarmid, [facility].  Brief narrative 72 year old male with history of possible BPH, gout, seen by his primary urologist 3 weeks ago, admitted on 12/27 with progressive weakness, inability to walk, unable to perform ADLs, poor oral intake, some confusion, diarrhea, lightheadedness and a fall, dysuria and difficulty passing urine. Per family, he has been gradually declining over the last 4 months but was still able to perform his ADLs and was doing relatively okay until 3 weeks ago. He has rapidly declined in the last 3-4 days. In the ED, chest x-ray and CT head negative, white blood cell 13.6 and UA positive for nitrites, few bacteria and 0-2 white blood cells. He was admitted for further evaluation and management  Assessment/Plan:  - New leukocytosis 08/09/12  - ddx UTI vs gout attack -  urine culture pending  started empiric colchicine 08/09/12  - resolved by 08/10/2012   - Gout attack - started on 08/09/12 - placed on colchicine   C. difficile colitis - moderate  Started oral Flagyl on 08/06/12  On contact isolation.  Apparently had a colonoscopy a couple of years ago which was negative. Cannot recollect who did it and when exactly.  Switched to po vancomycin 08/07/12 due to patient having abdominal pain and feeling poorly.   Possible UTI/urinary retention/BPH.  Foley catheter was placed and patient will have voiding trials after rehab in the urologist office  UA not very impressive for UTI and given diarrhea from C. difficile colitis we suspect bacteriuria is asymptomatic - patient did receive iv Rocephin on 08/05/12   Outpatient followup with primary urologist for voiding trials after successful snf rehab . Continued on  finasteride and tamsulosin  Dehydration  Secondary to poor oral intake.  Patient did receive  iv fluids from admission until 08/07/12   resolved  Severe Protein Caloric Malnutrition  nutrition consultation and started supplements   Failure to thrive/altered mental status   Patient has been declining over the last couple of months with progressive weight loss, decreased oral intake and generalized weakness. Unclear etiology. Extensive workup did not reveal any cause   Differential diagnosis include malignancy, dementia and? Psychiatric illness-per family, patient "does situps constantly nonintentionally".  Suspect severe depression - started remeron daily on 08/08/12 to help with appetite  too   Frequent falls  Likely secondary to generalized weakness and deconditioning.   SNF for rehabilitation.     Code Status:  Full code. Family Communication: son [name], [name]. [name] daughter and HCPOA Disposition Plan: SNF   Consultants:  None  Procedures:  Foley 12/27 >  Antibiotics:  IV Rocephin 12/27>>12/28  Po Flagyl 12/28- 12/29  Vanco po 12/29   HPI/Subjective:  gout pain better - more energy   Objective: Filed Vitals:   08/09/12 0543 08/09/12 1310 08/09/12 2025 08/10/12 0605  BP: 105/73 138/83 103/73 133/71  Pulse: 103 110 113 94  Temp: 97.4 F (36.3 C) 98.5 F (36.9 C) 97.8 F (36.6 C) 98.3 F (36.8 C)  TempSrc: Oral Oral Oral Oral  Resp: 16 18 19 14   Height:      Weight:      SpO2: 97% 97% 95% 94%   Patient Vitals for the past 24 hrs:  BP Temp Temp src Pulse Resp SpO2  08/10/12 0605 133/71 mmHg 98.3 F (36.8 C) Oral 94  14  94 %  08/09/12 2025 103/73  mmHg 97.8 F (36.6 C) Oral 113  19  95 %  08/09/12 1310 138/83 mmHg 98.5 F (36.9 C) Oral 110  18  97 %     Intake/Output Summary (Last 24 hours) at 08/10/12 0833 Last data filed at 08/10/12 0606  Gross per 24 hour  Intake    720 ml  Output   1000 ml  Net   -280 ml   Filed Weights   08/06/12 0500 08/07/12 0606 08/08/12 0627  Weight: 49.896 kg (110 lb) 49.669  kg (109 lb 8 oz) 49.17 kg (108 lb 6.4 oz)    Exam:   Alert , oriented x2.  Cvs: rrr, no m,r,g   Rs: ctab , no w.rc,   Abdomen non distended minimal tenderness, hyperactive bowel sounds   Tender 1st metatarso phalangeal joint   Data Reviewed: Basic Metabolic Panel:  Lab 08/10/12 1610 08/07/12 0411 08/06/12 0409 08/05/12 1837 08/05/12 1049  NA 134* 138 136 -- 133*  K 4.1 3.8 3.7 -- 4.0  CL 96 102 103 -- 96  CO2 31 28 28  -- 27  GLUCOSE 113* 103* 110* -- 116*  BUN 23 13 17  -- 19  CREATININE 0.99 0.85 0.94 0.86 0.93  CALCIUM 9.9 8.7 8.5 -- 9.7  MG -- -- -- 2.1 --  PHOS -- -- -- 2.7 --   Liver Function Tests:  Lab 08/05/12 1049  AST 20  ALT 26  ALKPHOS 80  BILITOT 0.7  PROT 7.0  ALBUMIN 3.4*   No results found for this basename: LIPASE:5,AMYLASE:5 in the last 168 hours No results found for this basename: AMMONIA:5 in the last 168 hours CBC:  Lab 08/10/12 0423 08/09/12 0405 08/07/12 0411 08/06/12 0409 08/05/12 1837  WBC 9.7 13.7* 11.3* 13.7* 15.6*  NEUTROABS -- -- -- -- --  HGB 15.2 15.9 13.6 12.6* 14.0  HCT 45.7 47.2 40.6 38.0* 41.6  MCV 87.7 86.9 87.3 87.4 86.3  PLT 229 202 177 185 204   Cardiac Enzymes:  Lab 08/05/12 1049  CKTOTAL --  CKMB --  CKMBINDEX --  TROPONINI <0.30   BNP (last 3 results) No results found for this basename: PROBNP:3 in the last 8760 hours CBG: No results found for this basename: GLUCAP:5 in the last 168 hours  Recent Results (from the past 240 hour(s))  URINE CULTURE     Status: Normal   Collection Time   08/05/12  6:30 PM      Component Value Range Status Comment   Specimen Description URINE, CATHETERIZED   Final    Special Requests Immunocompromised   Final    Culture  Setup Time 08/06/2012 01:56   Final    Colony Count NO GROWTH   Final    Culture NO GROWTH   Final    Report Status 08/07/2012 FINAL   Final   CLOSTRIDIUM DIFFICILE BY PCR     Status: Abnormal   Collection Time   08/05/12  8:00 PM      Component Value  Range Status Comment   C difficile by pcr POSITIVE (*) NEGATIVE Final   STOOL CULTURE     Status: Normal   Collection Time   08/05/12  8:00 PM      Component Value Range Status Comment   Specimen Description STOOL   Final    Special Requests Immunocompromised   Final    Culture     Final    Value: NO SALMONELLA, SHIGELLA, CAMPYLOBACTER, YERSINIA, OR E.COLI 0157:H7 ISOLATED  Note: REDUCED NORMAL FLORA PRESENT   Report Status 08/09/2012 FINAL   Final   OVA AND PARASITE EXAMINATION     Status: Normal   Collection Time   08/05/12  8:44 PM      Component Value Range Status Comment   Specimen Description STOOL   Final    Special Requests Immunocompromised   Final    Ova and parasites NO OVA OR PARASITES SEEN   Final    Report Status 08/08/2012 FINAL   Final      Studies: No results found.  Scheduled Meds:    . antiseptic oral rinse  15 mL Mouth Rinse BID  . colchicine  0.6 mg Oral TID  . enoxaparin (LOVENOX) injection  40 mg Subcutaneous Q24H  . feeding supplement  237 mL Oral TID WC  . finasteride  5 mg Oral Daily  . mirtazapine  30 mg Oral QHS  . Tamsulosin HCl  0.8 mg Oral Daily  . vancomycin  125 mg Oral Q6H   Continuous Infusions:    Principal Problem:  *C. difficile colitis Active Problems:  UTI (lower urinary tract infection)  Failure to thrive  Altered mental status  Leukocytosis  Urinary retention  Diarrhea  Dehydration  Malnutrition  Depression  Severe protein-calorie malnutrition  Other lab data  Magnesium 2.1, phosphorus 2.7.  Vitamin B 12: 447.  A.m. cortisol: 15.6.  TSH 0.690.  PSA: 0.78.  RPR nonreactive.  HIV nonreactive  Stool culture, ova and parasites  Urine culture: Pending  FOBT: Negative    Brian Salinas  [facility] Pager [phone]. If 8PM-8AM, please contact night-coverage at [email or web address], password [email or web address] 08/10/2012, 8:33 AM  LOS: 5 days

## 2012-08-11 DIAGNOSIS — M109 Gout, unspecified: Secondary | ICD-10-CM | POA: Diagnosis present

## 2012-08-11 MED ORDER — COLCHICINE 0.6 MG PO TABS: 0.6000 mg | ORAL_TABLET | Freq: Every day | ORAL | Status: DC

## 2012-08-11 MED ORDER — VANCOMYCIN 50 MG/ML ORAL SOLUTION: 125.0000 mg | Freq: Four times a day (QID) | ORAL | Status: AC

## 2012-08-11 MED ORDER — CLONAZEPAM 0.5 MG PO TABS: 0.5000 mg | ORAL_TABLET | Freq: Every evening | ORAL | Status: DC | PRN

## 2012-08-11 MED ORDER — MIRTAZAPINE 30 MG PO TABS: 30.0000 mg | ORAL_TABLET | Freq: Every day | ORAL | Status: DC

## 2012-08-11 MED ORDER — ENSURE COMPLETE PO LIQD: 237.0000 mL | Freq: Three times a day (TID) | ORAL | Status: DC

## 2012-08-11 NOTE — Progress Notes (Signed)
Physical Therapy Treatment Patient Details Name: Brian Salinas MRN: [id number] DOB: Dec 21, 1940 Today's Date: 08/11/2012 Time: 2536-6440 PT Time Calculation (min): 28 min  PT Assessment / Plan / Recommendation Comments on Treatment Session  Pt ambulated 60' x 2 with noted improved gait pattern, however continues to require cues for upright posture due to forward head/shoulders.      Follow Up Recommendations  SNF     Does the patient have the potential to tolerate intense rehabilitation     Barriers to Discharge        Equipment Recommendations  Rolling walker with 5" wheels    Recommendations for Other Services    Frequency Min 3X/week   Plan Discharge plan remains appropriate    Precautions / Restrictions Precautions Precautions: Fall Precaution Comments: rigid throughout Restrictions Weight Bearing Restrictions: No   Pertinent Vitals/Pain Some pain mentioned where catheter is placed.     Mobility  Bed Mobility Bed Mobility: Supine to Sit;Sitting - Scoot to Edge of Bed Supine to Sit: 4: Min assist;HOB elevated;With rails Sitting - Scoot to Edge of Bed: 5: Supervision Details for Bed Mobility Assistance: cues for hand placement and scooting to EOB.  Transfers Transfers: Sit to Stand;Stand to Sit Sit to Stand: 4: Min assist;With upper extremity assist;From bed;From toilet;From chair/3-in-1 Stand to Sit: 4: Min assist;With upper extremity assist;With armrests;To chair/3-in-1;To toilet Details for Transfer Assistance: Performed x 3 reps in order to attempt bowel movement (x2) with cues for hand placement and safety when sitting/standing.  Pt stood by himself in restroom with cues to wait for assist before standing.  Ambulation/Gait Ambulation/Gait Assistance: 4: Min assist Ambulation Distance (Feet): 60 Feet (x2 reps) Assistive device: Rolling walker Ambulation/Gait Assistance Details: Cues for increased step width and stride length.  Pt much improved from prior  sessions.   Gait Pattern: Step-to pattern;Step-through pattern;Shuffle;Decreased step length - right;Decreased step length - left;Narrow base of support Gait velocity: decreased General Gait Details: gait deviations improved with increased speed and cues    Exercises Total Joint Exercises Quad Sets: AROM;Both;20 reps Heel Slides: AROM;Both;10 reps Straight Leg Raises: AROM;Both;10 reps General Exercises - Lower Extremity Hip ABduction/ADduction: Strengthening;Both;10 reps (pillow sqeeze x 10 reps)   PT Diagnosis:    PT Problem List:   PT Treatment Interventions:     PT Goals Acute Rehab PT Goals PT Goal Formulation: With patient/family Time For Goal Achievement: 08/21/12 Potential to Achieve Goals: Good Pt will go Supine/Side to Sit: with supervision PT Goal: Supine/Side to Sit - Progress: Progressing toward goal Pt will go Sit to Stand: with supervision PT Goal: Sit to Stand - Progress: Progressing toward goal Pt will Ambulate: 51 - 150 feet;with supervision;with least restrictive assistive device PT Goal: Ambulate - Progress: Progressing toward goal Pt will Perform Home Exercise Program: with supervision, verbal cues required/provided PT Goal: Perform Home Exercise Program - Progress: Progressing toward goal  Visit Information  Last PT Received On: 08/11/12 Assistance Needed: +1    Subjective Data  Subjective: I just want this catheter out so that I can have a bowel movement.  Patient Stated Goal: n/a   Cognition  Overall Cognitive Status: Impaired Area of Impairment: Memory;Following commands;Safety/judgement Arousal/Alertness: Awake/alert Orientation Level: Disoriented to;Time Behavior During Session: WFL for tasks performed Current Attention Level: Sustained Memory: Decreased recall of precautions Following Commands: Follows one step commands with increased time Safety/Judgement: Decreased safety judgement for tasks assessed;Decreased awareness of safety  precautions Awareness of Errors: Assistance required to identify errors made;Assistance required to correct  errors made    Balance  Balance Balance Assessed: Yes Static Standing Balance Static Standing - Balance Support: No upper extremity supported;During functional activity Static Standing - Level of Assistance: 4: Min assist Dynamic Standing Balance Dynamic Standing - Balance Support: During functional activity Dynamic Standing - Level of Assistance: 4: Min assist Dynamic Standing - Balance Activities: Lateral lean/weight shifting;Forward lean/weight shifting Dynamic Standing - Comments: Pt able to stand at sink and perform self care activties at min assist  End of Session PT - End of Session Activity Tolerance: Patient tolerated treatment well Patient left: in chair;with call bell/phone within reach;with chair alarm set Nurse Communication: Mobility status   GP     Page, Meribeth Mattes 08/11/2012, 9:09 AM

## 2012-08-11 NOTE — Progress Notes (Signed)
Occupational Therapy Treatment Patient Details Name: Brian Salinas MRN: [id number] DOB: Jan 18, 1941 Today's Date: 08/11/2012 Time: 1308-6578 OT Time Calculation (min): 23 min  OT Assessment / Plan / Recommendation Comments on Treatment Session Pt continuing to c/o foley discomfort. States he cannot have a bowel movement with it in. Ambulated to the bathroom for a BM x2 without success. Plan is for snf today or tomorrow.    Follow Up Recommendations  SNF    Barriers to Discharge       Equipment Recommendations  3 in 1 bedside comode    Recommendations for Other Services    Frequency Min 2X/week   Plan Discharge plan remains appropriate    Precautions / Restrictions Precautions Precautions: Fall   Pertinent Vitals/Pain     ADL  Grooming: Wash/dry hands;Wash/dry face;Brushing hair Where Assessed - Grooming: Supported standing Lower Body Dressing: +1 Total assistance Where Assessed - Lower Body Dressing: Unsupported sitting (to don socks.) Toilet Transfer: Minimal assistance Toilet Transfer Method: Sit to Barista: Comfort height toilet;Grab bars Toileting - Clothing Manipulation and Hygiene: Minimal assistance Where Assessed - Engineer, mining and Hygiene: Sit to stand from 3-in-1 or toilet Equipment Used: Rolling walker ADL Comments: Pt ambulated to bathroom with min A. Ambulates with small shuffling steps. Pt was unable to reach feet or cross either foot over opposite knee to don socks.    OT Diagnosis:    OT Problem List:   OT Treatment Interventions:     OT Goals ADL Goals Pt Will Perform Grooming: with supervision;Standing at sink ADL Goal: Grooming - Progress: Updated due to goal met Pt Will Transfer to Toilet: with supervision;with DME;Ambulation ADL Goal: Toilet Transfer - Progress: Updated due to goal met Pt Will Perform Toileting - Clothing Manipulation: with supervision;Sitting on 3-in-1 or toilet;Standing ADL Goal:  Toileting - Clothing Manipulation - Progress: Updated due to goal met Pt Will Perform Toileting - Hygiene: with supervision;Sit to stand from 3-in-1/toilet ADL Goal: Toileting - Hygiene - Progress: Updated due to goal met ADL Goal: Additional Goal #1 - Progress: Met ADL Goal: Additional Goal #2 - Progress: Met  Visit Information  Last OT Received On: 08/11/12 Assistance Needed: +1 PT/OT Co-Evaluation/Treatment: Yes    Subjective Data  Subjective: I hope they take this catheter out today.   Prior Functioning       Cognition  Overall Cognitive Status: Impaired Area of Impairment: Memory;Safety/judgement;Awareness of errors Arousal/Alertness: Awake/alert Orientation Level: Disoriented to;Time Behavior During Session: [facility] for tasks performed Current Attention Level: Sustained Memory: Decreased recall of precautions Following Commands: Follows one step commands with increased time Safety/Judgement: Decreased safety judgement for tasks assessed;Decreased awareness of safety precautions Awareness of Errors: Assistance required to identify errors made;Assistance required to correct errors made    Mobility  Shoulder Instructions Bed Mobility Supine to Sit: 4: Min assist;HOB elevated;With rails Details for Bed Mobility Assistance: min cues for hand placement and technique. Transfers Sit to Stand: 4: Min assist;With upper extremity assist;From bed;From toilet Stand to Sit: 4: Min assist;With upper extremity assist;With armrests;To chair/3-in-1;To toilet Details for Transfer Assistance: Max cues for hand placement. Pt very rigid and slow to move.       Exercises      Balance Static Standing Balance Static Standing - Balance Support: No upper extremity supported;During functional activity Static Standing - Level of Assistance: 4: Min assist   End of Session OT - End of Session Activity Tolerance: Patient tolerated treatment well Patient left: in chair;with call bell/phone  within  reach;with chair alarm set  GO     Ranen Doolin A OTR/L [phone] 08/11/2012, 8:48 AM

## 2012-08-11 NOTE — Progress Notes (Signed)
Clinical Social Worker facilitated pt discharge needs including contacting facility, faxing pt discharge information via [facility], discussing with pt and pt family at bedside, providing contact phone number to RN to call report, and arranging ambulance transportation for pt to [facility], pt family aware that ambulance company files pt clinicals with insurance company and cannot guarantee coverage for ambulance transport, pt daughter expressed understanding. No further social work needs identified at this time. Clinical Social Worker signing off.   Jacklynn Lewis, MSW, LCSWA  Clinical Social Work [phone]

## 2012-08-11 NOTE — Progress Notes (Addendum)
During 9AM floor huddle Dr. Lavera Guise verbally communicated with me that pt was due for discharge. Social worker set up transportation and clarified care needs would be met at [facility]. Pt was transferred by ambulance to [facility]. At 1519 reported off to [facility] nurse about health status of pt and his needs.

## 2012-08-11 NOTE — Discharge Summary (Signed)
Physician Discharge Summary  Brian Salinas:[id number] DOB: 30-Mar-1941 DOA: 08/05/2012  PCP: Brian Falcon, MD  Admit date: 08/05/2012 Discharge date: 08/11/2012  Time spent: 40 minutes  Recommendations for Outpatient Follow-up:  F/u with urology for voiding trials   Discharge Diagnoses:    *C. difficile colitis Gout attack   Failure to thrive  Altered mental status - resolved   Leukocytosis - resolved   Urinary retention - due to BPH s/p foley   Dehydration - resolved   Depression  Severe protein-calorie malnutrition   Discharge Condition: fair  Diet recommendation: regular  Filed Weights   08/06/12 0500 08/07/12 0606 08/08/12 0627  Weight: 49.896 kg (110 lb) 49.669 kg (109 lb 8 oz) 49.17 kg (108 lb 6.4 oz)    History of present illness:  Brian Salinas is a 72 y.o. male with PMH significant for BPH ?, Gout who was brought to the ED by family because patient has been very weak, unable to walk, unable to feed him self, not eating for last 2 weeks, worse over last 3 days. He has had problems with prostate enlargement. He saw an urologist and was started on medications. Per son, patient sometimes get confuse. The son also states that the patient "does sit-ups constantly and unintentionally." Over the last 4 months he has lost significant amount of weight. Patient has been declining over last 4 month but worse over last 2 weeks.  Patient fell the night prior to admission when he was trying to stand up from a chair. Patient relates he felt lightheaded prior to the fall.  Patients relates dysuria, increase frequency, decrease urine stream. He was not able to tell me when this symptoms started. He also relates diarrhea, multiple BM, watery. He denies vomiting, abdominal pain.  Patient is able to follow command, he is oriented to place and person, year. He said he has 3 children, 2 boy 1 girl. He is retired, he works in [employer].  Patient is frequently saying I need to  urinate. I need to stand up.      Hospital Course:  - New leukocytosis 08/09/12  - ddx UTI vs gout attack  - urine culture without growth  started empiric colchicine 08/09/12  - resolved by 08/10/2012  - Gout attack - started on 08/09/12  - placed on colchicine with marked improvement  C. difficile colitis - moderate  Started oral Flagyl on 08/06/12  On contact isolation.  Apparently had a colonoscopy a couple of years ago which was negative. Cannot recollect who did it and when exactly.  Switched to po vancomycin 08/07/12 due to patient having abdominal pain and feeling poorly. Patient to take 10 more days of Vanc at DC  Possible UTI/urinary retention/BPH.  Foley catheter was placed  UA not very impressive for UTI and given diarrhea from C. difficile colitis we suspect bacteriuria is asymptomatic - patient did receive iv Rocephin on 08/05/12 . Urine cultures were negative x2 Voiding trials as outpatient  Dehydration  Secondary to poor oral intake.  Patient did receive iv fluids from admission until 08/07/12  resolved Severe Protein Caloric Malnutrition  nutrition consultation and started protein supplements  Failure to thrive/altered mental status  Patient has been declining over the last couple of months with progressive weight loss, decreased oral intake and generalized weakness. Unclear etiology. Extensive workup did not reveal any cause  Magnesium 2.1, phosphorus 2.7.  Vitamin B 12: 447.  A.m. cortisol: 15.6.  TSH 0.690.  PSA: 0.78.  RPR  nonreactive.  HIV nonreactive  Stool culture, ova and parasites - negative  FOBT: Negative    Differential diagnosis include malignancy, dementia and? Psychiatric illness-per family, patient "does situps constantly nonintentionally".  Suspect severe depression - started remeron daily on 08/08/12 to help with appetite too  Frequent falls  Likely secondary to generalized weakness and deconditioning. SNF for  rehabilitation.     Procedures: none Consultations:  none  Discharge Exam: Filed Vitals:   08/10/12 0605 08/10/12 1317 08/10/12 2043 08/11/12 0446  BP: 133/71 117/68 108/79 120/70  Pulse: 94 94 90 99  Temp: 98.3 F (36.8 C) 97.3 F (36.3 C) 98.1 F (36.7 C) 98 F (36.7 C)  TempSrc: Oral Oral Oral Oral  Resp: 14 16 18 18   Height:      Weight:      SpO2: 94% 94% 94% 93%    General: axox3 Cardiovascular: rrr Respiratory: ctab   Discharge Instructions     Medication List     As of 08/11/2012  8:15 AM    STOP taking these medications         levofloxacin 500 MG tablet   Commonly known as: LEVAQUIN      TAKE these medications         clonazePAM 0.5 MG tablet   Commonly known as: KLONOPIN   Take 1 tablet (0.5 mg total) by mouth at bedtime as needed for anxiety. For sleep/anxiety.      colchicine 0.6 MG tablet   Take 1 tablet (0.6 mg total) by mouth daily.      feeding supplement Liqd   Take 237 mLs by mouth 3 (three) times daily with meals.      finasteride 5 MG tablet   Commonly known as: PROSCAR   Take 5 mg by mouth daily.      mirtazapine 30 MG tablet   Commonly known as: REMERON   Take 1 tablet (30 mg total) by mouth at bedtime.      phenazopyridine 200 MG tablet   Commonly known as: PYRIDIUM   Take 200 mg by mouth 3 (three) times daily as needed. For pain.      Tamsulosin HCl 0.4 MG Caps   Commonly known as: FLOMAX   Take 0.8 mg by mouth daily.      vancomycin 50 mg/mL oral solution   Commonly known as: VANCOCIN   Take 2.5 mLs (125 mg total) by mouth every 6 (six) hours.           Follow-up Information    Follow up with Brian Falcon, MD.   Contact information:   [phone] [place] [address] [phone]           The results of significant diagnostics from this hospitalization (including imaging, microbiology, ancillary and laboratory) are listed below for reference.    Significant Diagnostic Studies: Dg Chest 2  View  08/05/2012  *RADIOLOGY REPORT*  Clinical Data: Weakness, failure to thrive.  CHEST - 2 VIEW  Comparison: None.  Findings: Heart is normal size.  Lungs are clear.  No effusions. No acute bony abnormality.  IMPRESSION: No acute cardiopulmonary disease.   Original Report Authenticated By: Charlett Nose, M.D.    Ct Head Wo Contrast  08/05/2012  *RADIOLOGY REPORT*  Clinical Data: Weakness, failure to thrive.  CT HEAD WITHOUT CONTRAST  Technique:  Contiguous axial images were obtained from the base of the skull through the vertex without contrast.  Comparison: None.  Findings: Atherosclerotic and physiologic intracranial calcifications.  Diffuse parenchymal atrophy. Patchy areas of hypoattenuation in deep and periventricular white matter bilaterally. Negative for acute intracranial hemorrhage, mass lesion, acute infarction, midline shift, or mass-effect. Acute infarct may be inapparent on noncontrast CT. Ventricles and sulci symmetric. Bone windows demonstrate no focal lesion.  IMPRESSION:  1. Negative for bleed or other acute intracranial process.  2. Atrophy and nonspecific white matter changes   Original Report Authenticated By: D. Andria Rhein, MD    Dg Abd Portable 1v  08/06/2012  *RADIOLOGY REPORT*  Clinical Data: Abdominal pain.  Diarrhea.  PORTABLE ABDOMEN - 1 VIEW  Comparison: 02/19/2010  Findings: No evidence of dilated bowel loops or abnormal mass effect.  Two radiodensities are again seen in the right abdomen, consistent with renal calculi.  Largest measures 7 mm in diameter.  IMPRESSION:  1.  Normal bowel gas pattern. 2.  Stable right nephrolithiasis.   Original Report Authenticated By: Myles Rosenthal, M.D.     Microbiology: Recent Results (from the past 240 hour(s))  URINE CULTURE     Status: Normal   Collection Time   08/05/12  6:30 PM      Component Value Range Status Comment   Specimen Description URINE, CATHETERIZED   Final    Special Requests Immunocompromised   Final    Culture   Setup Time 08/06/2012 01:56   Final    Colony Count NO GROWTH   Final    Culture NO GROWTH   Final    Report Status 08/07/2012 FINAL   Final   CLOSTRIDIUM DIFFICILE BY PCR     Status: Abnormal   Collection Time   08/05/12  8:00 PM      Component Value Range Status Comment   C difficile by pcr POSITIVE (*) NEGATIVE Final   STOOL CULTURE     Status: Normal   Collection Time   08/05/12  8:00 PM      Component Value Range Status Comment   Specimen Description STOOL   Final    Special Requests Immunocompromised   Final    Culture     Final    Value: NO SALMONELLA, SHIGELLA, CAMPYLOBACTER, YERSINIA, OR E.COLI 0157:H7 ISOLATED     Note: REDUCED NORMAL FLORA PRESENT   Report Status 08/09/2012 FINAL   Final   OVA AND PARASITE EXAMINATION     Status: Normal   Collection Time   08/05/12  8:44 PM      Component Value Range Status Comment   Specimen Description STOOL   Final    Special Requests Immunocompromised   Final    Ova and parasites NO OVA OR PARASITES SEEN   Final    Report Status 08/08/2012 FINAL   Final   URINE CULTURE     Status: Normal   Collection Time   08/09/12  9:04 AM      Component Value Range Status Comment   Specimen Description URINE, CATHETERIZED   Final    Special Requests NONE   Final    Culture  Setup Time 08/09/2012 14:43   Final    Colony Count NO GROWTH   Final    Culture NO GROWTH   Final    Report Status 08/10/2012 FINAL   Final      Labs: Basic Metabolic Panel:  Lab 08/10/12 2130 08/07/12 0411 08/06/12 0409 08/05/12 1837 08/05/12 1049  NA 134* 138 136 -- 133*  K 4.1 3.8 3.7 -- 4.0  CL 96 102 103 -- 96  CO2 31 28 28  -- 27  GLUCOSE 113* 103* 110* -- 116*  BUN 23 13 17  -- 19  CREATININE 0.99 0.85 0.94 0.86 0.93  CALCIUM 9.9 8.7 8.5 -- 9.7  MG -- -- -- 2.1 --  PHOS -- -- -- 2.7 --   Liver Function Tests:  Lab 08/05/12 1049  AST 20  ALT 26  ALKPHOS 80  BILITOT 0.7  PROT 7.0  ALBUMIN 3.4*   No results found for this basename:  LIPASE:5,AMYLASE:5 in the last 168 hours No results found for this basename: AMMONIA:5 in the last 168 hours CBC:  Lab 08/10/12 0423 08/09/12 0405 08/07/12 0411 08/06/12 0409 08/05/12 1837  WBC 9.7 13.7* 11.3* 13.7* 15.6*  NEUTROABS -- -- -- -- --  HGB 15.2 15.9 13.6 12.6* 14.0  HCT 45.7 47.2 40.6 38.0* 41.6  MCV 87.7 86.9 87.3 87.4 86.3  PLT 229 202 177 185 204   Cardiac Enzymes:  Lab 08/05/12 1049  CKTOTAL --  CKMB --  CKMBINDEX --  TROPONINI <0.30   BNP: BNP (last 3 results) No results found for this basename: PROBNP:3 in the last 8760 hours CBG: No results found for this basename: GLUCAP:5 in the last 168 hours     Signed:  Audry Pecina  [facility] 08/11/2012, 8:15 AM

## 2012-08-11 NOTE — Progress Notes (Signed)
Clinical Social Worker received notification from MD that pt medically stable for discharge. Clinical Social Worker spoke with [facility] who confirmed bed availability for today. Clinical Social Worker spoke with pt daughter, Joellyn Haff who plans to complete admission paperwork at facility at 1pm. Once admission paperwork completed, Clinical Social Worker to facilitate pt discharge needs at that time. RN aware.   Jacklynn Lewis, MSW, LCSWA  Clinical Social Work [phone]

## 2014-11-24 ENCOUNTER — Emergency Department (HOSPITAL_COMMUNITY)
Admission: EM | Admit: 2014-11-24 | Discharge: 2014-11-24 | Disposition: A | Discharge status: Home / Self Care | Payer: Medicare Other | Attending: Emergency Medicine | Admitting: Emergency Medicine

## 2014-11-24 ENCOUNTER — Emergency Department (HOSPITAL_COMMUNITY): Payer: Medicare Other

## 2014-11-24 ENCOUNTER — Encounter (HOSPITAL_COMMUNITY): Payer: Self-pay | Admitting: Emergency Medicine

## 2014-11-24 DIAGNOSIS — R339 Retention of urine, unspecified: Secondary | ICD-10-CM | POA: Diagnosis not present

## 2014-11-24 DIAGNOSIS — N200 Calculus of kidney: Secondary | ICD-10-CM

## 2014-11-24 DIAGNOSIS — Z87891 Personal history of nicotine dependence: Secondary | ICD-10-CM | POA: Diagnosis not present

## 2014-11-24 DIAGNOSIS — Z8739 Personal history of other diseases of the musculoskeletal system and connective tissue: Secondary | ICD-10-CM | POA: Diagnosis not present

## 2014-11-24 DIAGNOSIS — Z79899 Other long term (current) drug therapy: Secondary | ICD-10-CM | POA: Insufficient documentation

## 2014-11-24 LAB — URINE MICROSCOPIC-ADD ON

## 2014-11-24 LAB — URINALYSIS, ROUTINE W REFLEX MICROSCOPIC
BILIRUBIN URINE: NEGATIVE
GLUCOSE, UA: NEGATIVE mg/dL
KETONES UR: 15 mg/dL — AB
Nitrite: NEGATIVE
PROTEIN: NEGATIVE mg/dL
SPECIFIC GRAVITY, URINE: 1.023 (ref 1.005–1.030)
UROBILINOGEN UA: 0.2 mg/dL (ref 0.0–1.0)
pH: 6 (ref 5.0–8.0)

## 2014-11-24 LAB — I-STAT CHEM 8, ED
BUN: 25 mg/dL — ABNORMAL HIGH (ref 6–23)
CHLORIDE: 102 mmol/L (ref 96–112)
CREATININE: 1.1 mg/dL (ref 0.50–1.35)
Calcium, Ion: 1.14 mmol/L (ref 1.13–1.30)
GLUCOSE: 108 mg/dL — AB (ref 70–99)
HEMATOCRIT: 48 % (ref 39.0–52.0)
Hemoglobin: 16.3 g/dL (ref 13.0–17.0)
POTASSIUM: 4.1 mmol/L (ref 3.5–5.1)
Sodium: 139 mmol/L (ref 135–145)
TCO2: 22 mmol/L (ref 0–100)

## 2014-11-24 MED ORDER — OXYCODONE-ACETAMINOPHEN 5-325 MG PO TABS
2.0000 | ORAL_TABLET | Freq: Once | ORAL | Status: AC
  Administered 2014-11-24: 2 via ORAL
  Filled 2014-11-24: qty 2

## 2014-11-24 MED ORDER — OXYCODONE-ACETAMINOPHEN 5-325 MG PO TABS: 1.0000 | ORAL_TABLET | Freq: Three times a day (TID) | ORAL | Status: DC | PRN

## 2014-11-24 NOTE — ED Notes (Signed)
80 ml Upon bladder scan

## 2014-11-24 NOTE — ED Provider Notes (Signed)
CSN: [id number]     Arrival date & time 11/24/14  1154 History   First MD Initiated Contact with Patient 11/24/14 1216     Chief Complaint  Patient presents with  . Urinary Retention     (Consider location/radiation/quality/duration/timing/severity/associated sxs/prior Treatment) HPI Comments: Pt states that he has had problem with feeling like he is completely emptying his bladder. Pt states that he has history of similar and had a catheter placed 2 years ago. Had a normal bm this morning. No fever, vomiting or abdominal pain. Nothing makes the pain better or worse. He states that he is urinating he just isn't urinating very much  The history is provided by the patient. No language interpreter was used.    Past Medical History  Diagnosis Date  . Bladder troubles   . Gout    Past Surgical History  Procedure Laterality Date  . No past surgeries     No family history on file. History  Substance Use Topics  . Smoking status: Former Smoker -- 20 years    Types: Cigarettes    Quit date: 08/05/1997  . Smokeless tobacco: Never Used  . Alcohol Use: No    Review of Systems  All other systems reviewed and are negative.     Allergies  Review of patient's allergies indicates no known allergies.  Home Medications   Prior to Admission medications   Medication Sig Start Date End Date Taking? Authorizing Provider  finasteride (PROSCAR) 5 MG tablet Take 5 mg by mouth daily.   Yes Historical Provider, MD  Tamsulosin HCl (FLOMAX) 0.4 MG CAPS Take 0.8 mg by mouth daily.   Yes Historical Provider, MD   BP 150/89 mmHg  Pulse 116  Temp(Src) 97.8 F (36.6 C) (Oral)  Resp 20  SpO2 95% Physical Exam  Constitutional: He is oriented to person, place, and time. He appears well-developed and well-nourished.  Cardiovascular: Normal rate and regular rhythm.   Pulmonary/Chest: Effort normal and breath sounds normal.  Abdominal: Soft. Bowel sounds are normal. There is no tenderness. There  is no CVA tenderness.  Musculoskeletal: Normal range of motion.  Neurological: He is alert and oriented to person, place, and time.  Skin: Skin is warm and dry.  Psychiatric: He has a normal mood and affect.  Nursing note and vitals reviewed.   ED Course  Procedures (including critical care time) Labs Review Labs Reviewed  URINALYSIS, ROUTINE W REFLEX MICROSCOPIC - Abnormal; Notable for the following:    Hgb urine dipstick MODERATE (*)    Ketones, ur 15 (*)    Leukocytes, UA TRACE (*)    All other components within normal limits  I-STAT CHEM 8, ED - Abnormal; Notable for the following:    BUN 25 (*)    Glucose, Bld 108 (*)    All other components within normal limits  URINE MICROSCOPIC-ADD ON    Imaging Review Ct Renal Stone Study  11/24/2014   CLINICAL DATA:  Patient with right lower quadrant pain since Wednesday. History of renal stones.  EXAM: CT ABDOMEN AND PELVIS WITHOUT CONTRAST  TECHNIQUE: Multidetector CT imaging of the abdomen and pelvis was performed following the standard protocol without IV contrast.  COMPARISON:  CT abdomen pelvis 02/12/2010  FINDINGS: Lower chest: Minimal dependent atelectasis right lower lobe. Normal heart size.  Hepatobiliary: Stable sub cm low-attenuation lesion near the hepatic dome (image 12; series 2). Liver is normal in size and contour. Gallbladder is unremarkable. No intrahepatic or extrahepatic biliary ductal dilatation.  Pancreas:  Unremarkable  Spleen: Unremarkable  Adrenals/Urinary Tract: Adrenal glands are unremarkable. There is an 8 mm nonobstructing stone within interpolar region of the right kidney. 11 mm low-attenuation lesion within the superior pole of the left kidney, nonspecific without contrast imaging however favored to represent a cyst. There is a 13 mm stone within the distal aspect of right ureter near the UVJ. There is no upstream dilatation of the right ureter or evidence right-sided hydronephrosis.  Stomach/Bowel: Stool is present  throughout the colon. No abnormal bowel wall thickening or evidence for bowel obstruction. Normal appendix. No free fluid or free intraperitoneal air.  Vascular/Lymphatic: Scattered calcified atherosclerotic plaque involving the abdominal aorta. No retroperitoneal lymphadenopathy.  Other: Central dystrophic calcifications in the prostate which is mildly enlarged.  Musculoskeletal: No aggressive or acute appearing osseous lesions. Bilateral L5 pars defects.  IMPRESSION: There is a nonobstructing 13 mm stone within the distal right ureter near the level of the UVJ.  Additionally there is a nonobstructing 8 mm stone within the interpolar region of the right kidney.  No hydronephrosis.   Electronically Signed   By: Annia Beltrew  Davis M.D.   On: 11/24/2014 15:14     EKG Interpretation None      MDM   Final diagnoses:  Urinary retention  Renal stone    Spoke with dr. Perley Jainmcdiarmid with radiology and pt is to follow up in the office. Pt requesting some oral pain medication here for his mild discomfort. Pt is not febrile and no infection noted in the urine    Teressa LowerVrinda Eraina Winnie, NP 11/24/14 1634  Glynn OctaveStephen Rancour, MD 11/24/14 [id number]

## 2014-11-24 NOTE — Discharge Instructions (Signed)

## 2014-11-24 NOTE — ED Notes (Addendum)
Pt from home reports difficulty urinating and not being able to empty bladder x3 days. Pt sts that he has had catheter before for the same. Pt is A&O and in NAD. Pt ambulated to room w/o difficulty and with steady gait

## 2014-11-29 ENCOUNTER — Encounter (HOSPITAL_COMMUNITY): Payer: Self-pay | Admitting: *Deleted

## 2014-11-29 ENCOUNTER — Other Ambulatory Visit: Payer: Self-pay | Admitting: Urology

## 2014-11-29 NOTE — Anesthesia Preprocedure Evaluation (Addendum)
Anesthesia Evaluation  Patient identified by MRN, date of birth, ID band Patient awake    Reviewed: Allergy & Precautions, H&P , NPO status , Patient's Chart, lab work & pertinent test results  Airway Mallampati: II  TM Distance: >3 FB Neck ROM: full    Dental  (+) Edentulous Upper, Edentulous Lower, Dental Advisory Given   Pulmonary neg pulmonary ROS, former smoker,  breath sounds clear to auscultation  Pulmonary exam normal       Cardiovascular Exercise Tolerance: Good negative cardio ROS  Rhythm:regular Rate:Normal  LAFB   Neuro/Psych negative neurological ROS  negative psych ROS   GI/Hepatic Neg liver ROS, Failure to thrive. Severe protein calorie malnutrition   Endo/Other  negative endocrine ROS  Renal/GU negative Renal ROS  negative genitourinary   Musculoskeletal   Abdominal   Peds  Hematology negative hematology ROS (+)   Anesthesia Other Findings   Reproductive/Obstetrics negative OB ROS                            Anesthesia Physical Anesthesia Plan  ASA: III  Anesthesia Plan: General   Post-op Pain Management:    Induction: Intravenous  Airway Management Planned: LMA  Additional Equipment:   Intra-op Plan:   Post-operative Plan:   Informed Consent: I have reviewed the patients History and Physical, chart, labs and discussed the procedure including the risks, benefits and alternatives for the proposed anesthesia with the patient or authorized representative who has indicated his/her understanding and acceptance.   Dental Advisory Given  Plan Discussed with: CRNA and Surgeon  Anesthesia Plan Comments:         Anesthesia Quick Evaluation

## 2014-11-30 ENCOUNTER — Ambulatory Visit (HOSPITAL_COMMUNITY)
Admission: RE | Admit: 2014-11-30 | Discharge: 2014-11-30 | Disposition: A | Discharge status: Home / Self Care | Payer: Medicare Other | Source: Ambulatory Visit | Attending: Urology | Admitting: Urology

## 2014-11-30 ENCOUNTER — Ambulatory Visit (HOSPITAL_COMMUNITY): Payer: Medicare Other | Admitting: Anesthesiology

## 2014-11-30 ENCOUNTER — Encounter (HOSPITAL_COMMUNITY)
Admission: RE | Disposition: A | Discharge status: Home / Self Care | Payer: Self-pay | Source: Ambulatory Visit | Attending: Urology

## 2014-11-30 ENCOUNTER — Encounter (HOSPITAL_COMMUNITY): Payer: Self-pay | Admitting: *Deleted

## 2014-11-30 DIAGNOSIS — M109 Gout, unspecified: Secondary | ICD-10-CM | POA: Diagnosis not present

## 2014-11-30 DIAGNOSIS — N132 Hydronephrosis with renal and ureteral calculous obstruction: Secondary | ICD-10-CM | POA: Diagnosis not present

## 2014-11-30 DIAGNOSIS — Z79899 Other long term (current) drug therapy: Secondary | ICD-10-CM | POA: Diagnosis not present

## 2014-11-30 DIAGNOSIS — Z87891 Personal history of nicotine dependence: Secondary | ICD-10-CM | POA: Insufficient documentation

## 2014-11-30 DIAGNOSIS — Z79891 Long term (current) use of opiate analgesic: Secondary | ICD-10-CM | POA: Insufficient documentation

## 2014-11-30 DIAGNOSIS — N202 Calculus of kidney with calculus of ureter: Secondary | ICD-10-CM | POA: Diagnosis present

## 2014-11-30 HISTORY — PX: CYSTOSCOPY WITH RETROGRADE PYELOGRAM, URETEROSCOPY AND STENT PLACEMENT: SHX5789

## 2014-11-30 HISTORY — PX: HOLMIUM LASER APPLICATION: SHX5852

## 2014-11-30 HISTORY — DX: Personal history of urinary calculi: Z87.442

## 2014-11-30 LAB — CBC
HEMATOCRIT: 45.2 % (ref 39.0–52.0)
HEMOGLOBIN: 15.5 g/dL (ref 13.0–17.0)
MCH: 29.8 pg (ref 26.0–34.0)
MCHC: 34.3 g/dL (ref 30.0–36.0)
MCV: 86.9 fL (ref 78.0–100.0)
Platelets: 187 10*3/uL (ref 150–400)
RBC: 5.2 MIL/uL (ref 4.22–5.81)
RDW: 12.4 % (ref 11.5–15.5)
WBC: 7.5 10*3/uL (ref 4.0–10.5)

## 2014-11-30 SURGERY — CYSTOURETEROSCOPY, WITH RETROGRADE PYELOGRAM AND STENT INSERTION
Anesthesia: General | Laterality: Right

## 2014-11-30 MED ORDER — GENTAMICIN IN SALINE 1.6-0.9 MG/ML-% IV SOLN: 80.0000 mg | INTRAVENOUS | Status: DC

## 2014-11-30 MED ORDER — ONDANSETRON HCL 4 MG/2ML IJ SOLN
INTRAMUSCULAR | Status: AC
  Filled 2014-11-30: qty 2

## 2014-11-30 MED ORDER — ONDANSETRON HCL 4 MG/2ML IJ SOLN
INTRAMUSCULAR | Status: DC | PRN
  Administered 2014-11-30: 4 mg via INTRAVENOUS

## 2014-11-30 MED ORDER — LACTATED RINGERS IV SOLN
INTRAVENOUS | Status: DC | PRN
  Administered 2014-11-30 (×2): via INTRAVENOUS

## 2014-11-30 MED ORDER — IOHEXOL 300 MG/ML  SOLN
INTRAMUSCULAR | Status: DC | PRN
  Administered 2014-11-30: 19 mL

## 2014-11-30 MED ORDER — BELLADONNA ALKALOIDS-OPIUM 16.2-60 MG RE SUPP
RECTAL | Status: AC
  Filled 2014-11-30: qty 1

## 2014-11-30 MED ORDER — LIDOCAINE HCL (CARDIAC) 20 MG/ML IV SOLN
INTRAVENOUS | Status: AC
  Filled 2014-11-30: qty 5

## 2014-11-30 MED ORDER — BELLADONNA ALKALOIDS-OPIUM 16.2-60 MG RE SUPP
1.0000 | Freq: Every day | RECTAL | Status: DC
  Administered 2014-11-30: 1 via RECTAL

## 2014-11-30 MED ORDER — PHENYLEPHRINE 40 MCG/ML (10ML) SYRINGE FOR IV PUSH (FOR BLOOD PRESSURE SUPPORT)
PREFILLED_SYRINGE | INTRAVENOUS | Status: AC
  Filled 2014-11-30: qty 10

## 2014-11-30 MED ORDER — FENTANYL CITRATE (PF) 100 MCG/2ML IJ SOLN
INTRAMUSCULAR | Status: AC
Start: 2014-11-30 — End: 2014-11-30
  Filled 2014-11-30: qty 2

## 2014-11-30 MED ORDER — LIDOCAINE HCL (CARDIAC) 20 MG/ML IV SOLN
INTRAVENOUS | Status: DC | PRN
Start: 2014-11-30 — End: 2014-11-30
  Administered 2014-11-30: 50 mg via INTRAVENOUS

## 2014-11-30 MED ORDER — GENTAMICIN SULFATE 40 MG/ML IJ SOLN
5.0000 mg/kg | INTRAVENOUS | Status: AC
  Administered 2014-11-30: 250 mg via INTRAVENOUS
  Filled 2014-11-30: qty 6.25

## 2014-11-30 MED ORDER — FENTANYL CITRATE (PF) 100 MCG/2ML IJ SOLN
25.0000 ug | INTRAMUSCULAR | Status: DC | PRN
  Administered 2014-11-30 (×3): 50 ug via INTRAVENOUS

## 2014-11-30 MED ORDER — SODIUM CHLORIDE 0.9 % IR SOLN
Status: DC | PRN
  Administered 2014-11-30: 7000 mL

## 2014-11-30 MED ORDER — DEXAMETHASONE SODIUM PHOSPHATE 10 MG/ML IJ SOLN
INTRAMUSCULAR | Status: AC
  Filled 2014-11-30: qty 1

## 2014-11-30 MED ORDER — CEPHALEXIN 500 MG PO CAPS: 500.0000 mg | ORAL_CAPSULE | Freq: Two times a day (BID) | ORAL | Status: DC

## 2014-11-30 MED ORDER — FENTANYL CITRATE (PF) 100 MCG/2ML IJ SOLN
INTRAMUSCULAR | Status: AC
  Filled 2014-11-30: qty 2

## 2014-11-30 MED ORDER — KETOROLAC TROMETHAMINE 30 MG/ML IJ SOLN
INTRAMUSCULAR | Status: AC
  Filled 2014-11-30: qty 1

## 2014-11-30 MED ORDER — PHENYLEPHRINE HCL 10 MG/ML IJ SOLN
INTRAMUSCULAR | Status: DC | PRN
  Administered 2014-11-30: 80 ug via INTRAVENOUS
  Administered 2014-11-30: 40 ug via INTRAVENOUS
  Administered 2014-11-30 (×5): 80 ug via INTRAVENOUS
  Administered 2014-11-30 (×2): 40 ug via INTRAVENOUS

## 2014-11-30 MED ORDER — OXYCODONE-ACETAMINOPHEN 5-325 MG PO TABS: 1.0000 | ORAL_TABLET | Freq: Four times a day (QID) | ORAL | Status: DC | PRN

## 2014-11-30 MED ORDER — PHENYLEPHRINE 40 MCG/ML (10ML) SYRINGE FOR IV PUSH (FOR BLOOD PRESSURE SUPPORT)
PREFILLED_SYRINGE | INTRAVENOUS | Status: AC
  Filled 2014-11-30: qty 20

## 2014-11-30 MED ORDER — FENTANYL CITRATE (PF) 100 MCG/2ML IJ SOLN
INTRAMUSCULAR | Status: DC | PRN
  Administered 2014-11-30 (×4): 25 ug via INTRAVENOUS

## 2014-11-30 MED ORDER — PROPOFOL 10 MG/ML IV BOLUS
INTRAVENOUS | Status: AC
  Filled 2014-11-30: qty 20

## 2014-11-30 MED ORDER — SENNOSIDES-DOCUSATE SODIUM 8.6-50 MG PO TABS: 1.0000 | ORAL_TABLET | Freq: Two times a day (BID) | ORAL | Status: DC

## 2014-11-30 MED ORDER — PROPOFOL 10 MG/ML IV BOLUS
INTRAVENOUS | Status: DC | PRN
  Administered 2014-11-30: 100 mg via INTRAVENOUS

## 2014-11-30 MED ORDER — KETOROLAC TROMETHAMINE 30 MG/ML IJ SOLN
30.0000 mg | Freq: Once | INTRAMUSCULAR | Status: AC | PRN
  Administered 2014-11-30: 30 mg via INTRAVENOUS

## 2014-11-30 SURGICAL SUPPLY — 28 items
BAG URINE DRAINAGE (UROLOGICAL SUPPLIES) ×3 IMPLANT
BASKET LASER NITINOL 1.9FR (BASKET) ×2 IMPLANT
BASKET STNLS GEMINI 4WIRE 3FR (BASKET) IMPLANT
BASKET ZERO TIP NITINOL 2.4FR (BASKET) IMPLANT
BSKT STON RTRVL 120 1.9FR (BASKET) ×1
BSKT STON RTRVL GEM 120X11 3FR (BASKET)
BSKT STON RTRVL ZERO TP 2.4FR (BASKET)
CATH INTERMIT  6FR 70CM (CATHETERS) ×3 IMPLANT
CLOTH BEACON ORANGE TIMEOUT ST (SAFETY) ×3 IMPLANT
ELECT REM PT RETURN 9FT ADLT (ELECTROSURGICAL)
ELECTRODE REM PT RTRN 9FT ADLT (ELECTROSURGICAL) IMPLANT
FIBER LASER FLEXIVA 1000 (UROLOGICAL SUPPLIES) IMPLANT
FIBER LASER FLEXIVA 200 (UROLOGICAL SUPPLIES) ×2 IMPLANT
FIBER LASER FLEXIVA 365 (UROLOGICAL SUPPLIES) IMPLANT
FIBER LASER FLEXIVA 550 (UROLOGICAL SUPPLIES) IMPLANT
FIBER LASER TRAC TIP (UROLOGICAL SUPPLIES) ×2 IMPLANT
GLOVE BIOGEL M STRL SZ7.5 (GLOVE) ×3 IMPLANT
GOWN STRL REUS W/TWL LRG LVL3 (GOWN DISPOSABLE) ×6 IMPLANT
GUIDEWIRE ANG ZIPWIRE 038X150 (WIRE) ×3 IMPLANT
GUIDEWIRE STR DUAL SENSOR (WIRE) ×5 IMPLANT
IV NS IRRIG 3000ML ARTHROMATIC (IV SOLUTION) ×3 IMPLANT
PACK CYSTO (CUSTOM PROCEDURE TRAY) ×3 IMPLANT
SHEATH ACCESS URETERAL 38CM (SHEATH) ×2 IMPLANT
SHIELD EYE BINOCULAR (MISCELLANEOUS) IMPLANT
STENT POLARIS 5FRX24 (STENTS) ×2 IMPLANT
SYRINGE 10CC LL (SYRINGE) IMPLANT
SYRINGE IRR TOOMEY STRL 70CC (SYRINGE) IMPLANT
TUBE FEEDING 8FR 16IN STR KANG (MISCELLANEOUS) ×3 IMPLANT

## 2014-11-30 NOTE — Progress Notes (Addendum)
Patient is very demanding and does not want to cough and deep breath. Wants to get out of bed by climbing out the end of the stretcher. Assisted to BR where he voided bloody urine. Pad and mesh underwear placed on patient. Discharge instructions given to patient's neighbor, Nolene Bernheimddie Bates. Neighbor did not know that he was picking up patient from surgery. Patient's son , Eliezer ChampagneMark Heumann, dropped him off this AM and went to work. Patient is uncooperative and just wants to leave the hospital. Send home with urinal, mesh underwear, and pads.

## 2014-11-30 NOTE — Anesthesia Postprocedure Evaluation (Signed)
  Anesthesia Post-op Note  Patient: Brian Salinas  Procedure(s) Performed: Procedure(s) (LRB): CYSTOSCOPY WITH RETROGRADE PYELOGRAM, URETEROSCOPY AND STENT PLACEMENT RIGHT SIDE (Right) HOLMIUM LASER APPLICATION (Right)  Patient Location: PACU  Anesthesia Type: General  Level of Consciousness: awake and alert   Airway and Oxygen Therapy: Patient Spontanous Breathing  Post-op Pain: mild  Post-op Assessment: Post-op Vital signs reviewed, Patient's Cardiovascular Status Stable, Respiratory Function Stable, Patent Airway and No signs of Nausea or vomiting  Last Vitals:  Filed Vitals:   11/30/14 1015  BP: 173/94  Pulse: 105  Temp:   Resp: 15    Post-op Vital Signs: stable   Complications: No apparent anesthesia complications

## 2014-11-30 NOTE — H&P (Signed)
Brian Salinas is an 74 y.o. male.    Chief Complaint: Pre-OP Right Ureteroscopic Stone Manipulation  HPI:   1 - Nephrolithiasis - Rt UVJ 13mm ovoid stone with mild hydro, 8mm Rt renal stone on eval acutely worsened lower urinary tract symptoms 11/2014.  No fevers.  Past Medical History  Diagnosis Date  . Bladder troubles   . Gout   . History of kidney stones     Past Surgical History  Procedure Laterality Date  . No past surgeries    . Colonoscopy      History reviewed. No pertinent family history. Social History:  reports that he quit smoking about 17 years ago. His smoking use included Cigarettes. He quit after 20 years of use. He has never used smokeless tobacco. He reports that he does not drink alcohol or use illicit drugs.  Allergies: No Known Allergies  Medications Prior to Admission  Medication Sig Dispense Refill  . finasteride (PROSCAR) 5 MG tablet Take 5 mg by mouth daily.    Marland Kitchen. oxyCODONE-acetaminophen (PERCOCET/ROXICET) 5-325 MG per tablet Take 1-2 tablets by mouth every 8 (eight) hours as needed for severe pain. 15 tablet 0  . Tamsulosin HCl (FLOMAX) 0.4 MG CAPS Take 0.8 mg by mouth daily.      No results found for this or any previous visit (from the past 48 hour(s)). No results found.  Review of Systems  Constitutional: Negative.  Negative for fever.  HENT: Negative.   Eyes: Negative.   Respiratory: Negative.   Cardiovascular: Negative.   Gastrointestinal: Negative.   Genitourinary: Positive for urgency, frequency and flank pain.  Musculoskeletal: Negative.   Skin: Negative.   Neurological: Negative.   Endo/Heme/Allergies: Negative.   Psychiatric/Behavioral: Negative.     There were no vitals taken for this visit. Physical Exam  Constitutional: He appears well-developed.  HENT:  Head: Normocephalic.  Eyes: Pupils are equal, round, and reactive to light.  Neck: Normal range of motion.  Cardiovascular: Normal rate.   Respiratory: Effort normal.   GI: Soft.  Genitourinary:  Mild Rt CVAT  Musculoskeletal: Normal range of motion.  Neurological: He is alert.  Skin: Skin is warm.  Psychiatric: He has a normal mood and affect.     Assessment/Plan  1 - Nephrolithiasis - Rt ureteral stone quite large and very low chance of passage with medical therapy. SWL also not first choice given stone size and multifocality. Favor URS next available with goal of ipsilateral stone free. Would need peri-op stent given stone size.  We discussed ureteroscopic stone manipulation with basketing and laser-lithotripsy in detail.  We discussed risks including bleeding, infection, damage to kidney / ureter  bladder, rarely loss of kidney. We discussed anesthetic risks and rare but serious surgical complications including DVT, PE, MI, and mortality. We specifically addressed that in 5-10% of cases a staged approach is required with stenting followed by re-attempt ureteroscopy if anatomy unfavorable.   The patient voiced understanding and wises to proceed today as planned.  Brian Salinas 11/30/2014, 5:33 AM

## 2014-11-30 NOTE — Transfer of Care (Signed)
Immediate Anesthesia Transfer of Care Note  Patient: Brian Salinas  Procedure(s) Performed: Procedure(s): CYSTOSCOPY WITH RETROGRADE PYELOGRAM, URETEROSCOPY AND STENT PLACEMENT RIGHT SIDE (Right) HOLMIUM LASER APPLICATION (Right)  Patient Location: PACU  Anesthesia Type:General  Level of Consciousness:  awake, patient cooperative and responds to stimulation  Airway & Oxygen Therapy:Patient Spontanous Breathing and Patient connected to face mask oxgen  Post-op Assessment:  Report given to PACU RN and Post -op Vital signs reviewed and stable  Post vital signs:  Reviewed and stable  Last Vitals:  Filed Vitals:   11/30/14 0606  BP: 144/84  Pulse: 93  Temp: 36.4 C  Resp: 16    Complications: No apparent anesthesia complications

## 2014-11-30 NOTE — Op Note (Signed)
NAMEVIVEK, Brian Salinas NO.:  [id number]  MEDICAL RECORD NO.:  [id number]  LOCATION:  [facility]                         FACILITY:  [facility]  PHYSICIAN:  Sebastian Ache, MD     DATE OF BIRTH:  1941-07-07  DATE OF PROCEDURE:  11/30/2014                               OPERATIVE REPORT   DIAGNOSES:  Right ureteral and renal stones, non-refractory renal colic.  PROCEDURE: 1. Cystoscopy with right retrograde pyelogram and interpretation. 2. Right ureteroscopy with laser lithotripsy. 3. Insertion of right ureteral stent 5 x 24 Polaris with tether.  SURGEON:  Sebastian Ache, MD  ESTIMATED BLOOD LOSS:  Nil.  COMPLICATIONS:  None.  SPECIMENS:  Right ureteral and renal stone for compositional analysis.  FINDINGS: 1. Distal right ureteral stone with proximal mild to moderate     hydroureteronephrosis. 2. Right mid-pole intrarenal stone. 3. Complete resolution of all stone fragments larger than 1/3rd mm     following laser lithotripsy and basket extraction.  INDICATION:  Mr. 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  The options were discussed for management including medical therapy versus shockwave lithotripsy versus ureteroscopy for stone free and he wished to proceed with latter.  Informed consent was signed and placed in medical record.  PROCEDURE IN DETAIL:  The patient being Kawhi Diebold confirmed and procedure being right ureteroscopic stone manipulation was confirmed. Procedure was carried out.  Time-out was performed.  Intravenous antibiotics were administered.  General anesthesia was introduced.  The patient was placed into a low lithotomy position.  Sterile field was created by  prepping and draping the patient's penis, perineum, and proximal thighs using iodine x3.  Next, cystourethroscopy was performed using a 23-French rigid cystoscope with 30-degree offset lens. Inspection of the anterior and posterior urethrae were unremarkable. Inspection of the urinary bladder revealed no diverticula, calcifications, or papular lesions.  Ureteral orifices were singleton bilaterally.  The right ureteral orifice was cannulated with a 6-French end-hole catheter and right retrograde pyelogram was obtained.  Right retrograde pyelogram demonstrated a single right ureter with single system right kidney.  There was a filling defect of void and appearance in the right distal ureter, this consistent with known stone. There was mild-to-moderate hydroureteronephrosis above this.  There was no other radiodensity in the mid pole area corresponding to likely known right renal stone.  A 0.038 ZIPwire was advanced at the level of the upper pole and set aside as a safety wire.  An 8-French feeding tube was placed in urinary bladder for pressure release.  Next, semi-rigid ureteroscopy was performed of the distal right ureter alongside as a separate Sensor working wire.  The stone in question of the distal ureter was encountered and appeared to be much too large for simple basketing.  As such, holmium laser energy was applied to the stone using settings of 0.4 joules and 20 hertz with a 200 nanometer fiber, and the stone was fragmented into pieces  approximately 2 mm or less in diameter. These were then sequentially grasped and brought to the level of the urinary bladder were completely removed and set aside for compositional analysis.  Repeat ureteroscopic examination of the rest of length of the entire right ureter revealed no additional calcifications or mucosal abnormalities.  The semi-rigid ureteroscope was then exchanged for a 12/14, 38-cm ureteral access sheath over a sensor working  wire to the level of proximal ureter.  Next, flexible digital ureteroscopy was performed of the proximal ureter and systematic inspection of the right kidney x3.  This revealed a mid pole stone as anticipated, this also appeared to be too large for simple basketing.  As such, holmium laser energy supplied to the stone using settings of 0.3 joules and 20 hertz and was fragmented into pieces, 1 to 2 mm in diameter.  These were then sequentially grasped with Escape basket, brought out in their entirety, set aside for compositional analysis.  Repeat panendoscopic examination of the kidney revealed complete resolution of all stone fragments larger than 1/3rd mm.  There was no evidence of perforation.  There was excellent hemostasis.  The ureteroscope and sheath were removed under continuous ureteroscopic vision and no mucosal abnormalities were found. Given the large ureteral stone and use of ureteral access sheath, it was felt that a temporary ureteral stenting would be warranted.  As such, a new 5 x 24 Polaris-type stent was placed using cystoscopic and fluoroscopic guidance.  Good proximal and distal deployment were noted. Bladder was emptied per cystoscope.  The tether of the stent was fashioned at the dorsum of the penis.  The procedure was terminated. The patient tolerated the procedure well.  There were no immediate periprocedural complications.  The patient was taken to postanesthesia care unit in stable condition.          ______________________________ Sebastian Acheheodore Sayra Frisby, MD     TM/MEDQ  D:  11/30/2014  T:  11/30/2014  Job:  [id number]

## 2014-11-30 NOTE — Brief Op Note (Signed)
11/30/2014  8:59 AM  PATIENT:  Brian Salinas  74 y.o. male  PRE-OPERATIVE DIAGNOSIS:  RIGHT URETERAL STONES, FLANK PAIN  POST-OPERATIVE DIAGNOSIS:  RIGHT URETERAL STONES, FLANK PAIN  PROCEDURE:  Procedure(s): CYSTOSCOPY WITH RETROGRADE PYELOGRAM, URETEROSCOPY AND STENT PLACEMENT (Right) HOLMIUM LASER APPLICATION (Right)  SURGEON:  Surgeon(s) and Role:    * Sebastian Acheheodore Kitzia Camus, MD - Primary  PHYSICIAN ASSISTANT:   ASSISTANTS: none   ANESTHESIA:   general  EBL:     BLOOD ADMINISTERED:none  DRAINS: none   LOCAL MEDICATIONS USED:  NONE  SPECIMEN:  Source of Specimen:  Rt ureteral + renal stones  DISPOSITION OF SPECIMEN:  [facility] for compositional analysis  COUNTS:  YES  TOURNIQUET:  * No tourniquets in log *  DICTATION: .Other Dictation: Dictation Number [id number]  PLAN OF CARE: Discharge to home after PACU  PATIENT DISPOSITION:  PACU - hemodynamically stable.   Delay start of Pharmacological VTE agent (>24hrs) due to surgical blood loss or risk of bleeding: yes

## 2014-11-30 NOTE — Discharge Instructions (Signed)
1 - You may have urinary urgency (bladder spasms), bloody urine on / off, and pass small stone fragements with stent in place. This is normal.  2 - Call MD or go to ER for fever >102, severe pain / nausea / vomiting not relieved by medications, or acute change in medical status  3 - Remove tethered stent on Monday morning at home by pulling on string, then blue-white plastic tubing, and discarding. Dr. Berneice HeinrichManny is in the office Monday if any problems arise. IF YOU DO NOT FEEL COMFORTABLE TAKING OUT STENT AT HOME, CALL [phone] TO HAVE STENT TAKEN OUT AT [facility].    General Anesthesia, Care After Refer to this sheet in the next few weeks. These instructions provide you with information on caring for yourself after your procedure. Your health care provider may also give you more specific instructions. Your treatment has been planned according to current medical practices, but problems sometimes occur. Call your health care provider if you have any problems or questions after your procedure. WHAT TO EXPECT AFTER THE PROCEDURE After the procedure, it is typical to experience:  Sleepiness.  Nausea and vomiting. HOME CARE INSTRUCTIONS  For the first 24 hours after general anesthesia:  Have a responsible person with you.  Do not drive a car. If you are alone, do not take public transportation.  Do not drink alcohol.  Do not take medicine that has not been prescribed by your health care provider.  Do not sign important papers or make important decisions.  You may resume a normal diet and activities as directed by your health care provider.  Change bandages (dressings) as directed.  If you have questions or problems that seem related to general anesthesia, call the hospital and ask for the anesthetist or anesthesiologist on call. SEEK MEDICAL CARE IF:  You have nausea and vomiting that continue the day after anesthesia.  You develop a rash. SEEK IMMEDIATE MEDICAL CARE IF:    You have difficulty breathing.  You have chest pain.  You have any allergic problems. Document Released: 11/02/2000 Document Revised: 08/01/2013 Document Reviewed: 02/09/2013 [facility] Patient Information 2015 [place]. This information is not intended to replace advice given to you by your health care provider. Make sure you discuss any questions you have with your health care provider.

## 2014-11-30 NOTE — Anesthesia Procedure Notes (Signed)
Procedure Name: LMA Insertion Date/Time: 11/30/2014 7:41 AM Performed by: Epimenio SarinJARVELA, Tiamarie Furnari R Pre-anesthesia Checklist: Patient identified, Emergency Drugs available, Suction available, Patient being monitored and Timeout performed Patient Re-evaluated:Patient Re-evaluated prior to inductionOxygen Delivery Method: Circle system utilized Preoxygenation: Pre-oxygenation with 100% oxygen Intubation Type: IV induction Ventilation: Mask ventilation without difficulty LMA: LMA with gastric port inserted LMA Size: 4.0 Number of attempts: 1 Dental Injury: Teeth and Oropharynx as per pre-operative assessment

## 2014-12-03 ENCOUNTER — Encounter (HOSPITAL_COMMUNITY): Payer: Self-pay | Admitting: Urology

## 2015-01-25 ENCOUNTER — Emergency Department (HOSPITAL_COMMUNITY): Payer: Medicare Other

## 2015-01-25 ENCOUNTER — Encounter (HOSPITAL_COMMUNITY): Payer: Self-pay | Admitting: Family Medicine

## 2015-01-25 ENCOUNTER — Observation Stay (HOSPITAL_COMMUNITY)
Admission: EM | Admit: 2015-01-25 | Discharge: 2015-01-29 | Disposition: A | Discharge status: Skilled Nursing Facility | Payer: Medicare Other | Attending: Internal Medicine | Admitting: Internal Medicine

## 2015-01-25 DIAGNOSIS — N400 Enlarged prostate without lower urinary tract symptoms: Secondary | ICD-10-CM | POA: Diagnosis not present

## 2015-01-25 DIAGNOSIS — R627 Adult failure to thrive: Secondary | ICD-10-CM | POA: Diagnosis present

## 2015-01-25 DIAGNOSIS — M109 Gout, unspecified: Secondary | ICD-10-CM | POA: Diagnosis not present

## 2015-01-25 DIAGNOSIS — M5134 Other intervertebral disc degeneration, thoracic region: Secondary | ICD-10-CM | POA: Insufficient documentation

## 2015-01-25 DIAGNOSIS — G319 Degenerative disease of nervous system, unspecified: Secondary | ICD-10-CM | POA: Insufficient documentation

## 2015-01-25 DIAGNOSIS — R200 Anesthesia of skin: Secondary | ICD-10-CM

## 2015-01-25 DIAGNOSIS — M5106 Intervertebral disc disorders with myelopathy, lumbar region: Secondary | ICD-10-CM | POA: Insufficient documentation

## 2015-01-25 DIAGNOSIS — R634 Abnormal weight loss: Secondary | ICD-10-CM

## 2015-01-25 DIAGNOSIS — Z87891 Personal history of nicotine dependence: Secondary | ICD-10-CM | POA: Diagnosis not present

## 2015-01-25 DIAGNOSIS — Z87442 Personal history of urinary calculi: Secondary | ICD-10-CM | POA: Diagnosis not present

## 2015-01-25 DIAGNOSIS — M4802 Spinal stenosis, cervical region: Principal | ICD-10-CM | POA: Insufficient documentation

## 2015-01-25 DIAGNOSIS — M4854XA Collapsed vertebra, not elsewhere classified, thoracic region, initial encounter for fracture: Secondary | ICD-10-CM | POA: Diagnosis not present

## 2015-01-25 DIAGNOSIS — I959 Hypotension, unspecified: Secondary | ICD-10-CM | POA: Diagnosis not present

## 2015-01-25 DIAGNOSIS — Z681 Body mass index (BMI) 19 or less, adult: Secondary | ICD-10-CM | POA: Insufficient documentation

## 2015-01-25 DIAGNOSIS — M6281 Muscle weakness (generalized): Secondary | ICD-10-CM | POA: Diagnosis not present

## 2015-01-25 DIAGNOSIS — M4712 Other spondylosis with myelopathy, cervical region: Secondary | ICD-10-CM | POA: Insufficient documentation

## 2015-01-25 DIAGNOSIS — R2681 Unsteadiness on feet: Secondary | ICD-10-CM | POA: Diagnosis not present

## 2015-01-25 DIAGNOSIS — K7689 Other specified diseases of liver: Secondary | ICD-10-CM | POA: Insufficient documentation

## 2015-01-25 DIAGNOSIS — E43 Unspecified severe protein-calorie malnutrition: Secondary | ICD-10-CM | POA: Diagnosis present

## 2015-01-25 DIAGNOSIS — M436 Torticollis: Secondary | ICD-10-CM | POA: Diagnosis not present

## 2015-01-25 DIAGNOSIS — R29898 Other symptoms and signs involving the musculoskeletal system: Secondary | ICD-10-CM | POA: Diagnosis present

## 2015-01-25 DIAGNOSIS — M4806 Spinal stenosis, lumbar region: Secondary | ICD-10-CM | POA: Insufficient documentation

## 2015-01-25 HISTORY — DX: Benign prostatic hyperplasia without lower urinary tract symptoms: N40.0

## 2015-01-25 LAB — CBC WITH DIFFERENTIAL/PLATELET
Basophils Absolute: 0 10*3/uL (ref 0.0–0.1)
Basophils Relative: 0 % (ref 0–1)
EOS ABS: 0 10*3/uL (ref 0.0–0.7)
Eosinophils Relative: 0 % (ref 0–5)
HEMATOCRIT: 44.6 % (ref 39.0–52.0)
Hemoglobin: 15.4 g/dL (ref 13.0–17.0)
LYMPHS ABS: 1.5 10*3/uL (ref 0.7–4.0)
LYMPHS PCT: 14 % (ref 12–46)
MCH: 30.1 pg (ref 26.0–34.0)
MCHC: 34.5 g/dL (ref 30.0–36.0)
MCV: 87.3 fL (ref 78.0–100.0)
Monocytes Absolute: 0.9 10*3/uL (ref 0.1–1.0)
Monocytes Relative: 9 % (ref 3–12)
NEUTROS ABS: 7.9 10*3/uL — AB (ref 1.7–7.7)
Neutrophils Relative %: 77 % (ref 43–77)
Platelets: 225 10*3/uL (ref 150–400)
RBC: 5.11 MIL/uL (ref 4.22–5.81)
RDW: 12.4 % (ref 11.5–15.5)
WBC: 10.4 10*3/uL (ref 4.0–10.5)

## 2015-01-25 LAB — COMPREHENSIVE METABOLIC PANEL
ALT: 16 U/L — ABNORMAL LOW (ref 17–63)
AST: 22 U/L (ref 15–41)
Albumin: 3.8 g/dL (ref 3.5–5.0)
Alkaline Phosphatase: 67 U/L (ref 38–126)
Anion gap: 9 (ref 5–15)
BILIRUBIN TOTAL: 0.9 mg/dL (ref 0.3–1.2)
BUN: 20 mg/dL (ref 6–20)
CALCIUM: 10.6 mg/dL — AB (ref 8.9–10.3)
CO2: 33 mmol/L — ABNORMAL HIGH (ref 22–32)
Chloride: 98 mmol/L — ABNORMAL LOW (ref 101–111)
Creatinine, Ser: 1.18 mg/dL (ref 0.61–1.24)
GFR calc Af Amer: >60 mL/min (ref >60)
GFR calc non Af Amer: >60 mL/min (ref >60)
Glucose, Bld: 125 mg/dL — ABNORMAL HIGH (ref 65–99)
Potassium: 3.7 mmol/L (ref 3.5–5.1)
SODIUM: 140 mmol/L (ref 135–145)
Total Protein: 7.7 g/dL (ref 6.5–8.1)

## 2015-01-25 LAB — URINALYSIS, ROUTINE W REFLEX MICROSCOPIC
Bilirubin Urine: NEGATIVE
Glucose, UA: NEGATIVE mg/dL
Hgb urine dipstick: NEGATIVE
KETONES UR: 15 mg/dL — AB
LEUKOCYTES UA: NEGATIVE
NITRITE: NEGATIVE
PROTEIN: NEGATIVE mg/dL
Specific Gravity, Urine: >1.03 — ABNORMAL HIGH (ref 1.005–1.030)
Urobilinogen, UA: 0.2 mg/dL (ref 0.0–1.0)
pH: 5.5 (ref 5.0–8.0)

## 2015-01-25 LAB — LIPASE, BLOOD: Lipase: 23 U/L (ref 22–51)

## 2015-01-25 MED ORDER — SODIUM CHLORIDE 0.9 % IV BOLUS (SEPSIS)
1000.0000 mL | Freq: Once | INTRAVENOUS | Status: AC
  Administered 2015-01-25: 1000 mL via INTRAVENOUS

## 2015-01-25 MED ORDER — LORAZEPAM 2 MG/ML IJ SOLN
2.0000 mg | Freq: Once | INTRAMUSCULAR | Status: AC
  Administered 2015-01-25: 2 mg via INTRAVENOUS
  Filled 2015-01-25: qty 1

## 2015-01-25 NOTE — ED Notes (Signed)
MRI called stating patient having some issues with anxiety while they were trying to put him in the scanner. Dr. Jodi Mourning and PA Hedges made aware.

## 2015-01-25 NOTE — ED Notes (Signed)
Patient transported to CT, CT will then take PT to MRI.Marland KitchenMarland Kitchen

## 2015-01-25 NOTE — ED Notes (Signed)
Pt here for lower back pain radiating down legs, sharp and stabbing pains. sts sent here by neurologist for MRI. sts hasn't been eating and lost 20 pounds over the past few weeks. Denise diarrhea but sts hx of cdiff. sts constipation.

## 2015-01-25 NOTE — ED Notes (Signed)
PA at bedside.

## 2015-01-25 NOTE — Consult Note (Addendum)
Consult Reason for Consult:failure to thrive Referring Physician: Dr Jodi Mourning  CC: failure to thrive  HPI: Brian Salinas is an 74 y.o. male history of gout, kidney stones referred in from his neurologist for progressive decline over the past few weeks. History notable for decreased appetite, weight loss and difficulty walking. Notes a 20lb weight loss in the past few months. Has a 20 pack year history of smoking, not currently smoking (quit in 1998). Patient not able to provide further history and family not currently present.   Past Medical History  Diagnosis Date  . Bladder troubles   . Gout   . History of kidney stones     Past Surgical History  Procedure Laterality Date  . No past surgeries    . Colonoscopy    . Cystoscopy with retrograde pyelogram, ureteroscopy and stent placement Right 11/30/2014    Procedure: CYSTOSCOPY WITH RETROGRADE PYELOGRAM, URETEROSCOPY AND STENT PLACEMENT RIGHT SIDE;  Surgeon: Sebastian Ache, MD;  Location: [facility];  Service: Urology;  Laterality: Right;  . Holmium laser application Right 11/30/2014    Procedure: HOLMIUM LASER APPLICATION;  Surgeon: Sebastian Ache, MD;  Location: [facility];  Service: Urology;  Laterality: Right;    History reviewed. No pertinent family history.  Social History:  reports that he quit smoking about 17 years ago. His smoking use included Cigarettes. He quit after 20 years of use. He has never used smokeless tobacco. He reports that he does not drink alcohol or use illicit drugs.  No Known Allergies  Medications: I have reviewed the patient's current medications.  ROS: Out of a complete 14 system review, the patient complains of only the following symptoms, and all other reviewed systems are negative. +loss of appetite  Physical Examination: Filed Vitals:   01/25/15 1900  BP: 121/74  Pulse: 85  Temp:   Resp:    Physical Exam  Constitutional: He appears well-developed and well-nourished.  Psych: Affect appropriate  to situation Eyes: No scleral injection HENT: No OP obstrucion Head: Normocephalic.  Cardiovascular: Normal rate and regular rhythm.  Respiratory: Effort normal and breath sounds normal.  GI: Soft. Bowel sounds are normal. No distension. There is no tenderness.  Skin: WDI  Neurologic Examination Mental Status: Alert, oriented to name, hospital, unsure on date, thought content appropriate.  Speech fluent without evidence of aphasia. Mild dysarthria/hypophonic speech.  Able to follow simple steps without difficulty.  Cranial Nerves: II: optic discs not visualized, visual fields grossly normal, pupils equal, round, reactive to light III,IV, VI: ptosis not present, extra-ocular motions intact bilaterally V,VII: smile symmetric, facial light touch sensation normal bilaterally VIII: hearing normal bilaterally IX,X: gag reflex present XI: trapezius strength/neck flexion strength normal bilaterally XII: tongue strength normal, no fasciculations noted Motor: Right : Upper extremity    Left:     Upper extremity 5/5 deltoid       5/5 deltoid 5/5 biceps      5/5 biceps  5/5 triceps      5/5 triceps 5/5 hand grip      5/5 hand grip  Lower extremity     Lower extremity 5/5 hip flexor      5/5 hip flexor 5/5 quadricep      5/5 quadriceps  5/5 hamstrings     5/5 hamstrings 5/5 plantar flexion       5/5 plantar flexion 5/5 plantar extension     5/5 plantar extension Tone and bulk:normal tone throughout; no atrophy noted Sensory: Pinprick and light touch intact throughout, bilaterally  Deep Tendon Reflexes: 3+ biceps with cross body spread, 3+ brachioradialis, 3+ patellar with crossed adductor spread, 3+ AJs bilateral Plantars: Right: downgoing   Left: downgoing Cerebellar: Normal FTN, difficulty with HTS bilaterally. No resting or intention tremor noted. Slow movements overall but no apparent bradykinesia.  Gait: deferred due to fall risk  Laboratory Studies:   Basic Metabolic  Panel:  Recent Labs Lab 01/25/15 1500  NA 140  K 3.7  CL 98*  CO2 33*  GLUCOSE 125*  BUN 20  CREATININE 1.18  CALCIUM 10.6*    Liver Function Tests:  Recent Labs Lab 01/25/15 1500  AST 22  ALT 16*  ALKPHOS 67  BILITOT 0.9  PROT 7.7  ALBUMIN 3.8    Recent Labs Lab 01/25/15 1838  LIPASE 23   No results for input(s): AMMONIA in the last 168 hours.  CBC:  Recent Labs Lab 01/25/15 1500  WBC 10.4  NEUTROABS 7.9*  HGB 15.4  HCT 44.6  MCV 87.3  PLT 225    Cardiac Enzymes: No results for input(s): CKTOTAL, CKMB, CKMBINDEX, TROPONINI in the last 168 hours.  BNP: Invalid input(s): POCBNP  CBG: No results for input(s): GLUCAP in the last 168 hours.  Microbiology: Results for orders placed or performed during the hospital encounter of 08/05/12  Urine culture     Status: None   Collection Time: 08/05/12  6:30 PM  Result Value Ref Range Status   Specimen Description URINE, CATHETERIZED  Final   Special Requests Immunocompromised  Final   Culture  Setup Time 08/06/2012 01:56  Final   Colony Count NO GROWTH  Final   Culture NO GROWTH  Final   Report Status 08/07/2012 FINAL  Final  Clostridium Difficile by PCR     Status: Abnormal   Collection Time: 08/05/12  8:00 PM  Result Value Ref Range Status   C difficile by pcr POSITIVE (A) NEGATIVE Final    Comment: CRITICAL RESULT CALLED TO, READ BACK BY AND VERIFIED WITH: J.WILSON,RN 08/06/12 EHOWARD 1225  Stool culture     Status: None   Collection Time: 08/05/12  8:00 PM  Result Value Ref Range Status   Specimen Description STOOL  Final   Special Requests Immunocompromised  Final   Culture   Final    NO SALMONELLA, SHIGELLA, CAMPYLOBACTER, YERSINIA, OR E.COLI 0157:H7 ISOLATED Note: REDUCED NORMAL FLORA PRESENT   Report Status 08/09/2012 FINAL  Final  Ova and parasite examination     Status: None   Collection Time: 08/05/12  8:44 PM  Result Value Ref Range Status   Specimen Description STOOL  Final    Special Requests Immunocompromised  Final   Ova and parasites NO OVA OR PARASITES SEEN  Final   Report Status 08/08/2012 FINAL  Final  Urine culture     Status: None   Collection Time: 08/09/12  9:04 AM  Result Value Ref Range Status   Specimen Description URINE, CATHETERIZED  Final   Special Requests NONE  Final   Culture  Setup Time 08/09/2012 14:43  Final   Colony Count NO GROWTH  Final   Culture NO GROWTH  Final   Report Status 08/10/2012 FINAL  Final    Coagulation Studies: No results for input(s): LABPROT, INR in the last 72 hours.  Urinalysis:  Recent Labs Lab 01/25/15 1500  COLORURINE YELLOW  LABSPEC >1.030*  PHURINE 5.5  GLUCOSEU NEGATIVE  HGBUR NEGATIVE  BILIRUBINUR NEGATIVE  KETONESUR 15*  PROTEINUR NEGATIVE  UROBILINOGEN 0.2  NITRITE NEGATIVE  LEUKOCYTESUR  NEGATIVE    Lipid Panel:  No results found for: CHOL, TRIG, HDL, CHOLHDL, VLDL, LDLCALC  HgbA1C: No results found for: HGBA1C  Urine Drug Screen:  No results found for: LABOPIA, COCAINSCRNUR, LABBENZ, AMPHETMU, THCU, LABBARB  Alcohol Level: No results for input(s): ETH in the last 168 hours.  Other results:  Imaging: Ct Head Wo Contrast  01/25/2015   CLINICAL DATA:  Bilateral lower extremity numbness for 2-3 weeks.  EXAM: CT HEAD WITHOUT CONTRAST  TECHNIQUE: Contiguous axial images were obtained from the base of the skull through the vertex without intravenous contrast.  COMPARISON:  CT scan of August 05, 2012.  FINDINGS: Bony calvarium appears intact. Minimal diffuse cortical atrophy is noted. Mild chronic ischemic white matter disease is noted. No mass effect or midline shift is noted. Ventricular size is within normal limits. There is no evidence of mass lesion, hemorrhage or acute infarction.  IMPRESSION: Minimal diffuse cortical atrophy. Mild chronic ischemic white matter disease. No acute intracranial abnormality seen.   Electronically Signed   By: Lupita Raider, M.D.   On: 01/25/2015 19:53      Assessment/Plan:  73y/o gentleman sent in by his neurologist for failure to thrive and progressive overall decline over the past few weeks to months. History pertinent for decreased appetite, weight loss and gait instability. Exam shows generalized hyperreflexia. No change in bowel/bladder. Based on exam concern for possible cervical cord involvement. Agree with plan for MRI brain and C spine. With presentation and smoking history may need to rule out underlying malignancy as etiology of failure to thrive.   -MRI brain and C spine -check HIV, TSH, B12 -rehab and speech therapy evaluation -will follow up post imaging  Elspeth Cho, DO [facility] [phone]  If 7pm- 7am, please page neurology on call as listed in AMION. 01/25/2015, 8:26 PM

## 2015-01-25 NOTE — ED Provider Notes (Signed)
CSN: [id number]     Arrival date & time 01/25/15  1345 History   First MD Initiated Contact with Patient 01/25/15 1730     Chief Complaint  Patient presents with  . Back Pain  . Leg Pain  . Constipation    HPI   Pt presents with lower extremity neurologic complaints. Family notes that patient has lost approximately 20 pounds over the last month, 7 pounds over the last week. Decreased appetite, with worsening ambulation and stiff slow body movements. They report they took him to see neurology this morning( Dr. Laurell Josephs [facility]) who recommended they be seen in the emergency room for further neurological evaluation.  presentation patient was in no acute distress, complaining of bilateral lower extremity "pins and needles". Patient denies fever, chills, nausea, vomiting, abdominal pain, lower extremity swelling or edema, trauma to the back neck or lower extremities. Cognitive function intact, no personality changes.     Past Medical History  Diagnosis Date  . Bladder troubles   . Gout   . History of kidney stones    Past Surgical History  Procedure Laterality Date  . No past surgeries    . Colonoscopy    . Cystoscopy with retrograde pyelogram, ureteroscopy and stent placement Right 11/30/2014    Procedure: CYSTOSCOPY WITH RETROGRADE PYELOGRAM, URETEROSCOPY AND STENT PLACEMENT RIGHT SIDE;  Surgeon: Sebastian Ache, MD;  Location: [facility];  Service: Urology;  Laterality: Right;  . Holmium laser application Right 11/30/2014    Procedure: HOLMIUM LASER APPLICATION;  Surgeon: Sebastian Ache, MD;  Location: [facility];  Service: Urology;  Laterality: Right;   History reviewed. No pertinent family history. History  Substance Use Topics  . Smoking status: Former Smoker -- 20 years    Types: Cigarettes    Quit date: 08/05/1997  . Smokeless tobacco: Never Used  . Alcohol Use: No    Review of Systems  All other systems reviewed and are negative.   Allergies  Review of patient's  allergies indicates no known allergies.  Home Medications   Prior to Admission medications   Medication Sig Start Date End Date Taking? Authorizing Provider  finasteride (PROSCAR) 5 MG tablet Take 5 mg by mouth daily.   Yes Historical Provider, MD  senna-docusate (SENOKOT-S) 8.6-50 MG per tablet Take 1 tablet by mouth 2 (two) times daily. While taking pain meds to prevent constipation 11/30/14  Yes Sebastian Ache, MD  Tamsulosin HCl (FLOMAX) 0.4 MG CAPS Take 0.8 mg by mouth daily.   Yes Historical Provider, MD  cephALEXin (KEFLEX) 500 MG capsule Take 1 capsule (500 mg total) by mouth 2 (two) times daily. X 4 days to prevent post-op infection Patient not taking: Reported on 01/25/2015 11/30/14   Sebastian Ache, MD  oxyCODONE-acetaminophen (PERCOCET/ROXICET) 5-325 MG per tablet Take 1-2 tablets by mouth every 6 (six) hours as needed for moderate pain or severe pain. Post-operatively Patient not taking: Reported on 01/25/2015 11/30/14   Sebastian Ache, MD   BP 114/63 mmHg  Pulse 74  Temp(Src) 97.4 F (36.3 C) (Oral)  Resp 11  SpO2 98%    Physical Exam  Constitutional: He is oriented to person, place, and time. No distress.  HENT:  Head: Normocephalic and atraumatic.  Eyes: Conjunctivae are normal. Pupils are equal, round, and reactive to light. Right eye exhibits no discharge. Left eye exhibits no discharge. No scleral icterus.  Neck: No JVD present. No tracheal deviation present.  Cardiovascular: Normal rate.   Pulmonary/Chest: Effort normal and breath sounds normal. No stridor.  Abdominal: Soft. There is no tenderness.  Musculoskeletal:  Patient appears tense, body movements are slow and appears stiff. Movement of his upper extremities are lightly resisted. Patient has a shuffling gait, hyper reflexive patellar reflex bilateral. Head neck back nontender to palpation no signs of infection or trauma. 5 out of 5 strength in all major muscle groups.  Neurological: He is alert and oriented to  person, place, and time. No cranial nerve deficit or sensory deficit. Coordination normal. GCS eye subscore is 4. GCS verbal subscore is 5. GCS motor subscore is 6.  Reflex Scores:      Patellar reflexes are 3+ on the right side and 3+ on the left side. Sensation grossly intact no acute findings  Psychiatric: He has a normal mood and affect. His behavior is normal. Judgment and thought content normal.  Nursing note and vitals reviewed.   ED Course  Procedures (including critical care time) Labs Review Labs Reviewed  CBC WITH DIFFERENTIAL/PLATELET - Abnormal; Notable for the following:    Neutro Abs 7.9 (*)    All other components within normal limits  COMPREHENSIVE METABOLIC PANEL - Abnormal; Notable for the following:    Chloride 98 (*)    CO2 33 (*)    Glucose, Bld 125 (*)    Calcium 10.6 (*)    ALT 16 (*)    All other components within normal limits  URINALYSIS, ROUTINE W REFLEX MICROSCOPIC (NOT AT [facility] [address]) - Abnormal; Notable for the following:    Specific Gravity, Urine >1.030 (*)    Ketones, ur 15 (*)    All other components within normal limits  LIPASE, BLOOD  PROTIME-INR  TYPE AND SCREEN  ABO/RH    Imaging Review Ct Head Wo Contrast  01/25/2015   CLINICAL DATA:  Bilateral lower extremity numbness for 2-3 weeks.  EXAM: CT HEAD WITHOUT CONTRAST  TECHNIQUE: Contiguous axial images were obtained from the base of the skull through the vertex without intravenous contrast.  COMPARISON:  CT scan of August 05, 2012.  FINDINGS: Bony calvarium appears intact. Minimal diffuse cortical atrophy is noted. Mild chronic ischemic white matter disease is noted. No mass effect or midline shift is noted. Ventricular size is within normal limits. There is no evidence of mass lesion, hemorrhage or acute infarction.  IMPRESSION: Minimal diffuse cortical atrophy. Mild chronic ischemic white matter disease. No acute intracranial abnormality seen.   Electronically Signed   By: Lupita Raider, M.D.    On: 01/25/2015 19:53   Mr Brain Wo Contrast  01/25/2015   CLINICAL DATA:  Initial evaluation for progressive decline, back pain, difficulty walking  EXAM: MRI HEAD WITHOUT CONTRAST  MRI CERVICAL SPINE WITHOUT CONTRAST  MRI THORACIC SPINE WITHOUT CONTRAST  MRI LUMBAR SPINE WITHOUT CONTRAST  TECHNIQUE: Multiplanar, multiecho pulse sequences of the brain and spine and surrounding structures were obtained without intravenous contrast.  COMPARISON:  Prior CT from earlier the same day.  FINDINGS: MRI BRAIN:  Study is degraded by motion artifact.  Diffuse prominence of the CSF containing spaces is compatible with generalized atrophy. Patchy and confluent T2/FLAIR hyperintensity within the periventricular and deep white matter both cerebral hemispheres most consistent with chronic small vessel ischemic changes.  No acute infarct. No mass lesion, midline shift, or mass effect. No hydrocephalus. No extra-axial fluid collection. Normal intravascular flow voids maintained. No acute or chronic intracranial hemorrhage.  Orbits within normal limits. Paranasal sinuses are clear. No mastoid effusion. Inner ear structures normal.  Bone marrow signal intensity within normal limits.  MRI CERVICAL SPINE:  Craniocervical junction is widely patent.  There is reversal of the normal cervical lordosis with apex at C3. There is 3 mm anterolisthesis of C2 on C3, with 3 mm retrolisthesis of C3 on C4. Trace anterolisthesis of C5 on C6 and C7 on T1.  Vertebral body heights are maintained. No fracture. Heterogeneous degenerative disc disease present about the C3-4 intervertebral disc space. Otherwise vertebral body bone marrow signal intensity is normal  Signal intensity within the cervical spinal cord is normal. Spinal cord is kinked at the level of C3-4.  Paraspinous soft tissues within normal limits. Normal intravascular flow voids present within the vertebral arteries.  C2-C3: Mild diffuse degenerative disc osteophyte with bilateral  uncovertebral spurring. There is resultant moderate right with mild left foraminal stenosis. Posterior disc osteophyte partially effaces the ventral thecal sac and results in mild canal stenosis.  C3-C4: Diffuse degenerative disc osteophyte with degenerative endplate changes, bilateral uncovertebral spurring, and facet arthrosis. There is resultant severe right with moderate to severe left foraminal stenosis. There is severe canal narrowing with flattening of the cervical spinal cord. Thecal sac measures 7.7 mm in AP diameter. No associated cord signal changes.  C4-C5: Diffuse degenerative disc osteophyte with bilateral uncovertebral spurring, greater on the right. Superimposed mild facet hypertrophy. There is severe right with moderate to severe left foraminal stenosis. Posterior disc osteophyte largely effaces the ventral thecal sac and results in moderate to severe canal stenosis. Mild flattening of the cervical spinal cord without cord signal changes.  C5-C6: Bilateral uncovertebral spurring with prominent right-sided facet arthrosis. There is resultant severe right foraminal narrowing. Mild to moderate left foraminal stenosis. Posterior disc osteophyte minimally effaces the ventral thecal sac with resultant mild canal stenosis.  C6-C7: Bilateral uncovertebral hypertrophy with mild facet arthrosis. Resultant moderate bilateral foraminal stenosis. Posterior disc osteophyte results in mild canal narrowing.  C7-T1: Bilateral uncovertebral hypertrophy with facet arthrosis. There is left greater than right mild canal narrowing. Left paracentral disc osteophyte minimally indents the ventral thecal sac with resultant mild canal narrowing.  MRI THORACIC SPINE:  Vertebral bodies are normally aligned with preservation of the normal thoracic kyphosis. No listhesis.  Chronic compression deformity of the T9 vertebral body noted. Associated degenerative endplate Schmorl's node present at the superior endplate of T9.  Otherwise, vertebral body heights are maintained. No acute fracture. No marrow edema. No focal osseous lesion.  Signal intensity within the thoracic spinal cord is normal.  Paraspinous soft tissues are within normal limits. Visualized lungs are grossly clear.  Tiny left paracentral disc protrusion at T5-6 without significant stenosis. Tiny central disc protrusion at T7-8 without stenosis. Minimal disc bulge at T8-9 without stenosis. Shallow right foraminal disc protrusion at T9-10 without stenosis or neural impingement. Mild disc bulge at T10-11 without significant stenosis.  MRI LUMBAR SPINE:  In correlation with the thoracic spine, there appears to be transitional lumbosacral anatomy with partial lumbarization of the S1 segment. Vertebral bodies are normally aligned with preservation of the normal lumbar lordosis. Vertebral body heights are maintained. No acute fracture. No bone marrow edema. No focal osseous lesion.  Conus medullaris terminates normally at the L1-2 level. Signal intensity within the visualized cord is normal.  Paraspinous soft tissues demonstrate no acute abnormality. Visualized intra-abdominal aorta within normal limits. T2 hyperintense cyst partially visualized within the left kidney.  L1-2: Degenerative disc desiccation present. Superimposed small central disc extrusion indents the ventral thecal sac without significant canal stenosis. Foramina remain widely patent.  L2-3: Degenerative disc desiccation with  intervertebral disc space narrowing and mild diffuse disc bulge. Anterior endplate osteophytic spurring. No significant canal stenosis. Foramina remain widely patent.  L3-4: Mild diffuse disc bulge with disc desiccation. No focal disc herniation. No significant facet arthropathy. No canal or foraminal stenosis.  L4-5: Diffuse degenerative disc bulge with disc desiccation. No definite focal disc herniation. Superimposed facet and ligamentous hypertrophy. There is resultant mild canal and  right foraminal narrowing. Left neural foramen remains widely patent.  L5-S1: Degenerative disc desiccation with disc bulge. Superimposed shallow broad-based disc protrusion, slightly eccentric to the left. Superimposed facet and ligamentous hypertrophy. Protruding disc appears to contact the transiting left S1 nerve root as it courses through the lateral recess, and could potentially result in left lower extremity radiculopathy. There is mild canal and bilateral foraminal narrowing at this level.  IMPRESSION: MRI BRAIN:  1. No acute intracranial infarct or other process identified. 2. Generalized cerebral atrophy with mild to moderate chronic small vessel ischemic changes.  MRI CERVICAL SPINE:  1. Reversal of the normal cervical lordosis with apex at C3, resulting in severe canal stenosis at C3-4 and C4-5 with kinking of the spinal cord at these levels. No associated cord signal changes. 2. Additional multilevel degenerative disc disease with resultant foraminal narrowing as detailed above.  MRI THORACIC SPINE:  1. No acute abnormality within the thoracic spine. 2. Chronic compression deformity of the T9 vertebral body without significant retropulsion. 3. Mild multilevel degenerative disc disease as detailed above. No significant canal or foraminal stenosis.  MRI LUMBAR SPINE:  1. No acute abnormality within the lumbar spine. No significant canal stenosis. 2. Shallow posterior disc protrusion at L5-S1, closely approximating the transiting left S1 nerve root in the left lateral recess. This could potentially result in left lower extremity radicular symptoms. 3. Small central disc extrusion at L1-2 without significant stenosis. 4. Additional mild degenerative changes as above. 5. Transitional lumbosacral anatomy. Careful correlation with imaging and plain film radiography recommended prior to any potential future planned surgical intervention.   Electronically Signed   By: Rise Mu M.D.   On: 01/25/2015  23:40   Mr Cervical Spine Wo Contrast  01/25/2015   CLINICAL DATA:  Initial evaluation for progressive decline, back pain, difficulty walking  EXAM: MRI HEAD WITHOUT CONTRAST  MRI CERVICAL SPINE WITHOUT CONTRAST  MRI THORACIC SPINE WITHOUT CONTRAST  MRI LUMBAR SPINE WITHOUT CONTRAST  TECHNIQUE: Multiplanar, multiecho pulse sequences of the brain and spine and surrounding structures were obtained without intravenous contrast.  COMPARISON:  Prior CT from earlier the same day.  FINDINGS: MRI BRAIN:  Study is degraded by motion artifact.  Diffuse prominence of the CSF containing spaces is compatible with generalized atrophy. Patchy and confluent T2/FLAIR hyperintensity within the periventricular and deep white matter both cerebral hemispheres most consistent with chronic small vessel ischemic changes.  No acute infarct. No mass lesion, midline shift, or mass effect. No hydrocephalus. No extra-axial fluid collection. Normal intravascular flow voids maintained. No acute or chronic intracranial hemorrhage.  Orbits within normal limits. Paranasal sinuses are clear. No mastoid effusion. Inner ear structures normal.  Bone marrow signal intensity within normal limits.  MRI CERVICAL SPINE:  Craniocervical junction is widely patent.  There is reversal of the normal cervical lordosis with apex at C3. There is 3 mm anterolisthesis of C2 on C3, with 3 mm retrolisthesis of C3 on C4. Trace anterolisthesis of C5 on C6 and C7 on T1.  Vertebral body heights are maintained. No fracture. Heterogeneous degenerative disc disease present about  the C3-4 intervertebral disc space. Otherwise vertebral body bone marrow signal intensity is normal  Signal intensity within the cervical spinal cord is normal. Spinal cord is kinked at the level of C3-4.  Paraspinous soft tissues within normal limits. Normal intravascular flow voids present within the vertebral arteries.  C2-C3: Mild diffuse degenerative disc osteophyte with bilateral uncovertebral  spurring. There is resultant moderate right with mild left foraminal stenosis. Posterior disc osteophyte partially effaces the ventral thecal sac and results in mild canal stenosis.  C3-C4: Diffuse degenerative disc osteophyte with degenerative endplate changes, bilateral uncovertebral spurring, and facet arthrosis. There is resultant severe right with moderate to severe left foraminal stenosis. There is severe canal narrowing with flattening of the cervical spinal cord. Thecal sac measures 7.7 mm in AP diameter. No associated cord signal changes.  C4-C5: Diffuse degenerative disc osteophyte with bilateral uncovertebral spurring, greater on the right. Superimposed mild facet hypertrophy. There is severe right with moderate to severe left foraminal stenosis. Posterior disc osteophyte largely effaces the ventral thecal sac and results in moderate to severe canal stenosis. Mild flattening of the cervical spinal cord without cord signal changes.  C5-C6: Bilateral uncovertebral spurring with prominent right-sided facet arthrosis. There is resultant severe right foraminal narrowing. Mild to moderate left foraminal stenosis. Posterior disc osteophyte minimally effaces the ventral thecal sac with resultant mild canal stenosis.  C6-C7: Bilateral uncovertebral hypertrophy with mild facet arthrosis. Resultant moderate bilateral foraminal stenosis. Posterior disc osteophyte results in mild canal narrowing.  C7-T1: Bilateral uncovertebral hypertrophy with facet arthrosis. There is left greater than right mild canal narrowing. Left paracentral disc osteophyte minimally indents the ventral thecal sac with resultant mild canal narrowing.  MRI THORACIC SPINE:  Vertebral bodies are normally aligned with preservation of the normal thoracic kyphosis. No listhesis.  Chronic compression deformity of the T9 vertebral body noted. Associated degenerative endplate Schmorl's node present at the superior endplate of T9. Otherwise, vertebral  body heights are maintained. No acute fracture. No marrow edema. No focal osseous lesion.  Signal intensity within the thoracic spinal cord is normal.  Paraspinous soft tissues are within normal limits. Visualized lungs are grossly clear.  Tiny left paracentral disc protrusion at T5-6 without significant stenosis. Tiny central disc protrusion at T7-8 without stenosis. Minimal disc bulge at T8-9 without stenosis. Shallow right foraminal disc protrusion at T9-10 without stenosis or neural impingement. Mild disc bulge at T10-11 without significant stenosis.  MRI LUMBAR SPINE:  In correlation with the thoracic spine, there appears to be transitional lumbosacral anatomy with partial lumbarization of the S1 segment. Vertebral bodies are normally aligned with preservation of the normal lumbar lordosis. Vertebral body heights are maintained. No acute fracture. No bone marrow edema. No focal osseous lesion.  Conus medullaris terminates normally at the L1-2 level. Signal intensity within the visualized cord is normal.  Paraspinous soft tissues demonstrate no acute abnormality. Visualized intra-abdominal aorta within normal limits. T2 hyperintense cyst partially visualized within the left kidney.  L1-2: Degenerative disc desiccation present. Superimposed small central disc extrusion indents the ventral thecal sac without significant canal stenosis. Foramina remain widely patent.  L2-3: Degenerative disc desiccation with intervertebral disc space narrowing and mild diffuse disc bulge. Anterior endplate osteophytic spurring. No significant canal stenosis. Foramina remain widely patent.  L3-4: Mild diffuse disc bulge with disc desiccation. No focal disc herniation. No significant facet arthropathy. No canal or foraminal stenosis.  L4-5: Diffuse degenerative disc bulge with disc desiccation. No definite focal disc herniation. Superimposed facet and ligamentous hypertrophy. There  is resultant mild canal and right foraminal  narrowing. Left neural foramen remains widely patent.  L5-S1: Degenerative disc desiccation with disc bulge. Superimposed shallow broad-based disc protrusion, slightly eccentric to the left. Superimposed facet and ligamentous hypertrophy. Protruding disc appears to contact the transiting left S1 nerve root as it courses through the lateral recess, and could potentially result in left lower extremity radiculopathy. There is mild canal and bilateral foraminal narrowing at this level.  IMPRESSION: MRI BRAIN:  1. No acute intracranial infarct or other process identified. 2. Generalized cerebral atrophy with mild to moderate chronic small vessel ischemic changes.  MRI CERVICAL SPINE:  1. Reversal of the normal cervical lordosis with apex at C3, resulting in severe canal stenosis at C3-4 and C4-5 with kinking of the spinal cord at these levels. No associated cord signal changes. 2. Additional multilevel degenerative disc disease with resultant foraminal narrowing as detailed above.  MRI THORACIC SPINE:  1. No acute abnormality within the thoracic spine. 2. Chronic compression deformity of the T9 vertebral body without significant retropulsion. 3. Mild multilevel degenerative disc disease as detailed above. No significant canal or foraminal stenosis.  MRI LUMBAR SPINE:  1. No acute abnormality within the lumbar spine. No significant canal stenosis. 2. Shallow posterior disc protrusion at L5-S1, closely approximating the transiting left S1 nerve root in the left lateral recess. This could potentially result in left lower extremity radicular symptoms. 3. Small central disc extrusion at L1-2 without significant stenosis. 4. Additional mild degenerative changes as above. 5. Transitional lumbosacral anatomy. Careful correlation with imaging and plain film radiography recommended prior to any potential future planned surgical intervention.   Electronically Signed   By: Rise Mu M.D.   On: 01/25/2015 23:40   Mr  Thoracic Spine Wo Contrast  01/25/2015   CLINICAL DATA:  Initial evaluation for progressive decline, back pain, difficulty walking  EXAM: MRI HEAD WITHOUT CONTRAST  MRI CERVICAL SPINE WITHOUT CONTRAST  MRI THORACIC SPINE WITHOUT CONTRAST  MRI LUMBAR SPINE WITHOUT CONTRAST  TECHNIQUE: Multiplanar, multiecho pulse sequences of the brain and spine and surrounding structures were obtained without intravenous contrast.  COMPARISON:  Prior CT from earlier the same day.  FINDINGS: MRI BRAIN:  Study is degraded by motion artifact.  Diffuse prominence of the CSF containing spaces is compatible with generalized atrophy. Patchy and confluent T2/FLAIR hyperintensity within the periventricular and deep white matter both cerebral hemispheres most consistent with chronic small vessel ischemic changes.  No acute infarct. No mass lesion, midline shift, or mass effect. No hydrocephalus. No extra-axial fluid collection. Normal intravascular flow voids maintained. No acute or chronic intracranial hemorrhage.  Orbits within normal limits. Paranasal sinuses are clear. No mastoid effusion. Inner ear structures normal.  Bone marrow signal intensity within normal limits.  MRI CERVICAL SPINE:  Craniocervical junction is widely patent.  There is reversal of the normal cervical lordosis with apex at C3. There is 3 mm anterolisthesis of C2 on C3, with 3 mm retrolisthesis of C3 on C4. Trace anterolisthesis of C5 on C6 and C7 on T1.  Vertebral body heights are maintained. No fracture. Heterogeneous degenerative disc disease present about the C3-4 intervertebral disc space. Otherwise vertebral body bone marrow signal intensity is normal  Signal intensity within the cervical spinal cord is normal. Spinal cord is kinked at the level of C3-4.  Paraspinous soft tissues within normal limits. Normal intravascular flow voids present within the vertebral arteries.  C2-C3: Mild diffuse degenerative disc osteophyte with bilateral uncovertebral spurring.  There is  resultant moderate right with mild left foraminal stenosis. Posterior disc osteophyte partially effaces the ventral thecal sac and results in mild canal stenosis.  C3-C4: Diffuse degenerative disc osteophyte with degenerative endplate changes, bilateral uncovertebral spurring, and facet arthrosis. There is resultant severe right with moderate to severe left foraminal stenosis. There is severe canal narrowing with flattening of the cervical spinal cord. Thecal sac measures 7.7 mm in AP diameter. No associated cord signal changes.  C4-C5: Diffuse degenerative disc osteophyte with bilateral uncovertebral spurring, greater on the right. Superimposed mild facet hypertrophy. There is severe right with moderate to severe left foraminal stenosis. Posterior disc osteophyte largely effaces the ventral thecal sac and results in moderate to severe canal stenosis. Mild flattening of the cervical spinal cord without cord signal changes.  C5-C6: Bilateral uncovertebral spurring with prominent right-sided facet arthrosis. There is resultant severe right foraminal narrowing. Mild to moderate left foraminal stenosis. Posterior disc osteophyte minimally effaces the ventral thecal sac with resultant mild canal stenosis.  C6-C7: Bilateral uncovertebral hypertrophy with mild facet arthrosis. Resultant moderate bilateral foraminal stenosis. Posterior disc osteophyte results in mild canal narrowing.  C7-T1: Bilateral uncovertebral hypertrophy with facet arthrosis. There is left greater than right mild canal narrowing. Left paracentral disc osteophyte minimally indents the ventral thecal sac with resultant mild canal narrowing.  MRI THORACIC SPINE:  Vertebral bodies are normally aligned with preservation of the normal thoracic kyphosis. No listhesis.  Chronic compression deformity of the T9 vertebral body noted. Associated degenerative endplate Schmorl's node present at the superior endplate of T9. Otherwise, vertebral body heights  are maintained. No acute fracture. No marrow edema. No focal osseous lesion.  Signal intensity within the thoracic spinal cord is normal.  Paraspinous soft tissues are within normal limits. Visualized lungs are grossly clear.  Tiny left paracentral disc protrusion at T5-6 without significant stenosis. Tiny central disc protrusion at T7-8 without stenosis. Minimal disc bulge at T8-9 without stenosis. Shallow right foraminal disc protrusion at T9-10 without stenosis or neural impingement. Mild disc bulge at T10-11 without significant stenosis.  MRI LUMBAR SPINE:  In correlation with the thoracic spine, there appears to be transitional lumbosacral anatomy with partial lumbarization of the S1 segment. Vertebral bodies are normally aligned with preservation of the normal lumbar lordosis. Vertebral body heights are maintained. No acute fracture. No bone marrow edema. No focal osseous lesion.  Conus medullaris terminates normally at the L1-2 level. Signal intensity within the visualized cord is normal.  Paraspinous soft tissues demonstrate no acute abnormality. Visualized intra-abdominal aorta within normal limits. T2 hyperintense cyst partially visualized within the left kidney.  L1-2: Degenerative disc desiccation present. Superimposed small central disc extrusion indents the ventral thecal sac without significant canal stenosis. Foramina remain widely patent.  L2-3: Degenerative disc desiccation with intervertebral disc space narrowing and mild diffuse disc bulge. Anterior endplate osteophytic spurring. No significant canal stenosis. Foramina remain widely patent.  L3-4: Mild diffuse disc bulge with disc desiccation. No focal disc herniation. No significant facet arthropathy. No canal or foraminal stenosis.  L4-5: Diffuse degenerative disc bulge with disc desiccation. No definite focal disc herniation. Superimposed facet and ligamentous hypertrophy. There is resultant mild canal and right foraminal narrowing. Left  neural foramen remains widely patent.  L5-S1: Degenerative disc desiccation with disc bulge. Superimposed shallow broad-based disc protrusion, slightly eccentric to the left. Superimposed facet and ligamentous hypertrophy. Protruding disc appears to contact the transiting left S1 nerve root as it courses through the lateral recess, and could potentially result in left lower  extremity radiculopathy. There is mild canal and bilateral foraminal narrowing at this level.  IMPRESSION: MRI BRAIN:  1. No acute intracranial infarct or other process identified. 2. Generalized cerebral atrophy with mild to moderate chronic small vessel ischemic changes.  MRI CERVICAL SPINE:  1. Reversal of the normal cervical lordosis with apex at C3, resulting in severe canal stenosis at C3-4 and C4-5 with kinking of the spinal cord at these levels. No associated cord signal changes. 2. Additional multilevel degenerative disc disease with resultant foraminal narrowing as detailed above.  MRI THORACIC SPINE:  1. No acute abnormality within the thoracic spine. 2. Chronic compression deformity of the T9 vertebral body without significant retropulsion. 3. Mild multilevel degenerative disc disease as detailed above. No significant canal or foraminal stenosis.  MRI LUMBAR SPINE:  1. No acute abnormality within the lumbar spine. No significant canal stenosis. 2. Shallow posterior disc protrusion at L5-S1, closely approximating the transiting left S1 nerve root in the left lateral recess. This could potentially result in left lower extremity radicular symptoms. 3. Small central disc extrusion at L1-2 without significant stenosis. 4. Additional mild degenerative changes as above. 5. Transitional lumbosacral anatomy. Careful correlation with imaging and plain film radiography recommended prior to any potential future planned surgical intervention.   Electronically Signed   By: Rise Mu M.D.   On: 01/25/2015 23:40   Mr Lumbar Spine Wo  Contrast  01/25/2015   CLINICAL DATA:  Initial evaluation for progressive decline, back pain, difficulty walking  EXAM: MRI HEAD WITHOUT CONTRAST  MRI CERVICAL SPINE WITHOUT CONTRAST  MRI THORACIC SPINE WITHOUT CONTRAST  MRI LUMBAR SPINE WITHOUT CONTRAST  TECHNIQUE: Multiplanar, multiecho pulse sequences of the brain and spine and surrounding structures were obtained without intravenous contrast.  COMPARISON:  Prior CT from earlier the same day.  FINDINGS: MRI BRAIN:  Study is degraded by motion artifact.  Diffuse prominence of the CSF containing spaces is compatible with generalized atrophy. Patchy and confluent T2/FLAIR hyperintensity within the periventricular and deep white matter both cerebral hemispheres most consistent with chronic small vessel ischemic changes.  No acute infarct. No mass lesion, midline shift, or mass effect. No hydrocephalus. No extra-axial fluid collection. Normal intravascular flow voids maintained. No acute or chronic intracranial hemorrhage.  Orbits within normal limits. Paranasal sinuses are clear. No mastoid effusion. Inner ear structures normal.  Bone marrow signal intensity within normal limits.  MRI CERVICAL SPINE:  Craniocervical junction is widely patent.  There is reversal of the normal cervical lordosis with apex at C3. There is 3 mm anterolisthesis of C2 on C3, with 3 mm retrolisthesis of C3 on C4. Trace anterolisthesis of C5 on C6 and C7 on T1.  Vertebral body heights are maintained. No fracture. Heterogeneous degenerative disc disease present about the C3-4 intervertebral disc space. Otherwise vertebral body bone marrow signal intensity is normal  Signal intensity within the cervical spinal cord is normal. Spinal cord is kinked at the level of C3-4.  Paraspinous soft tissues within normal limits. Normal intravascular flow voids present within the vertebral arteries.  C2-C3: Mild diffuse degenerative disc osteophyte with bilateral uncovertebral spurring. There is resultant  moderate right with mild left foraminal stenosis. Posterior disc osteophyte partially effaces the ventral thecal sac and results in mild canal stenosis.  C3-C4: Diffuse degenerative disc osteophyte with degenerative endplate changes, bilateral uncovertebral spurring, and facet arthrosis. There is resultant severe right with moderate to severe left foraminal stenosis. There is severe canal narrowing with flattening of the cervical spinal cord.  Thecal sac measures 7.7 mm in AP diameter. No associated cord signal changes.  C4-C5: Diffuse degenerative disc osteophyte with bilateral uncovertebral spurring, greater on the right. Superimposed mild facet hypertrophy. There is severe right with moderate to severe left foraminal stenosis. Posterior disc osteophyte largely effaces the ventral thecal sac and results in moderate to severe canal stenosis. Mild flattening of the cervical spinal cord without cord signal changes.  C5-C6: Bilateral uncovertebral spurring with prominent right-sided facet arthrosis. There is resultant severe right foraminal narrowing. Mild to moderate left foraminal stenosis. Posterior disc osteophyte minimally effaces the ventral thecal sac with resultant mild canal stenosis.  C6-C7: Bilateral uncovertebral hypertrophy with mild facet arthrosis. Resultant moderate bilateral foraminal stenosis. Posterior disc osteophyte results in mild canal narrowing.  C7-T1: Bilateral uncovertebral hypertrophy with facet arthrosis. There is left greater than right mild canal narrowing. Left paracentral disc osteophyte minimally indents the ventral thecal sac with resultant mild canal narrowing.  MRI THORACIC SPINE:  Vertebral bodies are normally aligned with preservation of the normal thoracic kyphosis. No listhesis.  Chronic compression deformity of the T9 vertebral body noted. Associated degenerative endplate Schmorl's node present at the superior endplate of T9. Otherwise, vertebral body heights are maintained. No  acute fracture. No marrow edema. No focal osseous lesion.  Signal intensity within the thoracic spinal cord is normal.  Paraspinous soft tissues are within normal limits. Visualized lungs are grossly clear.  Tiny left paracentral disc protrusion at T5-6 without significant stenosis. Tiny central disc protrusion at T7-8 without stenosis. Minimal disc bulge at T8-9 without stenosis. Shallow right foraminal disc protrusion at T9-10 without stenosis or neural impingement. Mild disc bulge at T10-11 without significant stenosis.  MRI LUMBAR SPINE:  In correlation with the thoracic spine, there appears to be transitional lumbosacral anatomy with partial lumbarization of the S1 segment. Vertebral bodies are normally aligned with preservation of the normal lumbar lordosis. Vertebral body heights are maintained. No acute fracture. No bone marrow edema. No focal osseous lesion.  Conus medullaris terminates normally at the L1-2 level. Signal intensity within the visualized cord is normal.  Paraspinous soft tissues demonstrate no acute abnormality. Visualized intra-abdominal aorta within normal limits. T2 hyperintense cyst partially visualized within the left kidney.  L1-2: Degenerative disc desiccation present. Superimposed small central disc extrusion indents the ventral thecal sac without significant canal stenosis. Foramina remain widely patent.  L2-3: Degenerative disc desiccation with intervertebral disc space narrowing and mild diffuse disc bulge. Anterior endplate osteophytic spurring. No significant canal stenosis. Foramina remain widely patent.  L3-4: Mild diffuse disc bulge with disc desiccation. No focal disc herniation. No significant facet arthropathy. No canal or foraminal stenosis.  L4-5: Diffuse degenerative disc bulge with disc desiccation. No definite focal disc herniation. Superimposed facet and ligamentous hypertrophy. There is resultant mild canal and right foraminal narrowing. Left neural foramen remains  widely patent.  L5-S1: Degenerative disc desiccation with disc bulge. Superimposed shallow broad-based disc protrusion, slightly eccentric to the left. Superimposed facet and ligamentous hypertrophy. Protruding disc appears to contact the transiting left S1 nerve root as it courses through the lateral recess, and could potentially result in left lower extremity radiculopathy. There is mild canal and bilateral foraminal narrowing at this level.  IMPRESSION: MRI BRAIN:  1. No acute intracranial infarct or other process identified. 2. Generalized cerebral atrophy with mild to moderate chronic small vessel ischemic changes.  MRI CERVICAL SPINE:  1. Reversal of the normal cervical lordosis with apex at C3, resulting in severe canal stenosis at C3-4  and C4-5 with kinking of the spinal cord at these levels. No associated cord signal changes. 2. Additional multilevel degenerative disc disease with resultant foraminal narrowing as detailed above.  MRI THORACIC SPINE:  1. No acute abnormality within the thoracic spine. 2. Chronic compression deformity of the T9 vertebral body without significant retropulsion. 3. Mild multilevel degenerative disc disease as detailed above. No significant canal or foraminal stenosis.  MRI LUMBAR SPINE:  1. No acute abnormality within the lumbar spine. No significant canal stenosis. 2. Shallow posterior disc protrusion at L5-S1, closely approximating the transiting left S1 nerve root in the left lateral recess. This could potentially result in left lower extremity radicular symptoms. 3. Small central disc extrusion at L1-2 without significant stenosis. 4. Additional mild degenerative changes as above. 5. Transitional lumbosacral anatomy. Careful correlation with imaging and plain film radiography recommended prior to any potential future planned surgical intervention.   Electronically Signed   By: Rise Mu M.D.   On: 01/25/2015 23:40     EKG Interpretation   Date/Time:   Saturday January 26 2015 00:43:22 EDT Ventricular Rate:  71 PR Interval:  179 QRS Duration: 94 QT Interval:  441 QTC Calculation: 479 R Axis:   -73 Text Interpretation:  Failure to sense and/or capture (?magnet) No further  analysis attempted due to paced rhythm Confirmed by ZAVITZ  MD, JOSHUA  ([id number]) on 01/26/2015 1:01:38 AM      MDM   Final diagnoses:  Bilateral leg numbness  Failure to thrive in adult    Labs: PT INR, type and screen, lipase, CBC, CMP, urinalysis- significant for CO2 33, specific gravity 1.030  Imaging: CT head, MR brain, MR cervical thoracic lumbar- see attached information  Consults: neurosurgery, neurology,  hospitalist  Therapeutics: Ativan, normal saline  Plan: Patient was sent from neurologist Dr. Laurell Josephs at [facility] for neurological evaluation in the ED setting. Patient was seen by Dr. Hosie Poisson, extensive MRI and CT scans obtained with no neurosurgical emergency at this time. In order to obtain MR study patient required Ativan, blood pressures in the 90s and upper 80s were observed in the ED setting, patient was sleeping in exam bed in no acute distress, was arousable at that time. He is given normal saline with return into the low 100s. Due to patient's low blood pressure, need for further neurological evaluation, and rapidly progressing decline in weight loss patient will be admitted for further evaluation and management. Uncertain etiology of patient's deteriorating condition, no significant laboratory or diagnostic imaging to indicate cause. No acute events here in the ED setting, patient was evaluated by Dr. Lovell Sheehan with the hospitalist service who agreed to admit.      Eyvonne Mechanic, PA-C 01/26/15 0205  Blane Ohara, MD 01/29/15 2224

## 2015-01-25 NOTE — ED Notes (Signed)
Pt still in MRI 

## 2015-01-26 ENCOUNTER — Encounter (HOSPITAL_COMMUNITY): Payer: Self-pay | Admitting: Internal Medicine

## 2015-01-26 ENCOUNTER — Inpatient Hospital Stay (HOSPITAL_COMMUNITY): Payer: Medicare Other

## 2015-01-26 DIAGNOSIS — R208 Other disturbances of skin sensation: Secondary | ICD-10-CM

## 2015-01-26 DIAGNOSIS — R200 Anesthesia of skin: Secondary | ICD-10-CM | POA: Insufficient documentation

## 2015-01-26 DIAGNOSIS — I952 Hypotension due to drugs: Secondary | ICD-10-CM

## 2015-01-26 DIAGNOSIS — I959 Hypotension, unspecified: Secondary | ICD-10-CM | POA: Diagnosis present

## 2015-01-26 DIAGNOSIS — N400 Enlarged prostate without lower urinary tract symptoms: Secondary | ICD-10-CM | POA: Diagnosis not present

## 2015-01-26 DIAGNOSIS — R627 Adult failure to thrive: Secondary | ICD-10-CM

## 2015-01-26 DIAGNOSIS — R29898 Other symptoms and signs involving the musculoskeletal system: Secondary | ICD-10-CM | POA: Diagnosis not present

## 2015-01-26 DIAGNOSIS — E43 Unspecified severe protein-calorie malnutrition: Secondary | ICD-10-CM

## 2015-01-26 LAB — CBC
HCT: 42.4 % (ref 39.0–52.0)
Hemoglobin: 14.5 g/dL (ref 13.0–17.0)
MCH: 29.9 pg (ref 26.0–34.0)
MCHC: 34.2 g/dL (ref 30.0–36.0)
MCV: 87.4 fL (ref 78.0–100.0)
PLATELETS: 177 10*3/uL (ref 150–400)
RBC: 4.85 MIL/uL (ref 4.22–5.81)
RDW: 12.5 % (ref 11.5–15.5)
WBC: 8.9 10*3/uL (ref 4.0–10.5)

## 2015-01-26 LAB — BASIC METABOLIC PANEL
ANION GAP: 9 (ref 5–15)
BUN: 17 mg/dL (ref 6–20)
CALCIUM: 9.6 mg/dL (ref 8.9–10.3)
CO2: 30 mmol/L (ref 22–32)
Chloride: 99 mmol/L — ABNORMAL LOW (ref 101–111)
Creatinine, Ser: 1.12 mg/dL (ref 0.61–1.24)
GFR calc Af Amer: >60 mL/min (ref >60)
GFR calc non Af Amer: >60 mL/min (ref >60)
GLUCOSE: 103 mg/dL — AB (ref 65–99)
POTASSIUM: 3.5 mmol/L (ref 3.5–5.1)
SODIUM: 138 mmol/L (ref 135–145)

## 2015-01-26 LAB — HIV ANTIBODY (ROUTINE TESTING W REFLEX): HIV SCREEN 4TH GENERATION: NONREACTIVE

## 2015-01-26 LAB — PROTIME-INR
INR: 1.07 (ref 0.00–1.49)
Prothrombin Time: 14.1 seconds (ref 11.6–15.2)

## 2015-01-26 LAB — VITAMIN B12: VITAMIN B 12: 536 pg/mL (ref 180–914)

## 2015-01-26 LAB — PREALBUMIN: PREALBUMIN: 26.5 mg/dL (ref 18–38)

## 2015-01-26 LAB — TYPE AND SCREEN
ABO/RH(D): O NEG
ANTIBODY SCREEN: NEGATIVE

## 2015-01-26 LAB — TSH: TSH: 1.667 u[IU]/mL (ref 0.350–4.500)

## 2015-01-26 LAB — ABO/RH: ABO/RH(D): O NEG

## 2015-01-26 LAB — URIC ACID: URIC ACID, SERUM: 6.7 mg/dL (ref 4.4–7.6)

## 2015-01-26 MED ORDER — SODIUM CHLORIDE 0.9 % IV BOLUS (SEPSIS)
500.0000 mL | Freq: Once | INTRAVENOUS | Status: AC
  Administered 2015-01-26: 500 mL via INTRAVENOUS

## 2015-01-26 MED ORDER — OXYCODONE HCL 5 MG PO TABS: 5.0000 mg | ORAL_TABLET | ORAL | Status: DC | PRN

## 2015-01-26 MED ORDER — ALUM & MAG HYDROXIDE-SIMETH 200-200-20 MG/5ML PO SUSP: 30.0000 mL | Freq: Four times a day (QID) | ORAL | Status: DC | PRN

## 2015-01-26 MED ORDER — BOOST / RESOURCE BREEZE PO LIQD
1.0000 | Freq: Three times a day (TID) | ORAL | Status: DC
  Administered 2015-01-26 – 2015-01-28 (×6): 1 via ORAL

## 2015-01-26 MED ORDER — ACETAMINOPHEN 325 MG PO TABS: 650.0000 mg | ORAL_TABLET | Freq: Four times a day (QID) | ORAL | Status: DC | PRN

## 2015-01-26 MED ORDER — TAMSULOSIN HCL 0.4 MG PO CAPS
0.8000 mg | ORAL_CAPSULE | Freq: Every day | ORAL | Status: DC
  Administered 2015-01-26 – 2015-01-29 (×4): 0.8 mg via ORAL
  Filled 2015-01-26 (×4): qty 2

## 2015-01-26 MED ORDER — ONDANSETRON HCL 4 MG/2ML IJ SOLN: 4.0000 mg | Freq: Four times a day (QID) | INTRAMUSCULAR | Status: DC | PRN

## 2015-01-26 MED ORDER — SODIUM CHLORIDE 0.9 % IV SOLN
INTRAVENOUS | Status: DC
  Administered 2015-01-26 – 2015-01-28 (×2): via INTRAVENOUS

## 2015-01-26 MED ORDER — SENNOSIDES-DOCUSATE SODIUM 8.6-50 MG PO TABS
1.0000 | ORAL_TABLET | Freq: Two times a day (BID) | ORAL | Status: DC
  Administered 2015-01-26 – 2015-01-28 (×3): 1 via ORAL
  Filled 2015-01-26 (×8): qty 1

## 2015-01-26 MED ORDER — IOHEXOL 300 MG/ML  SOLN
25.0000 mL | INTRAMUSCULAR | Status: AC
  Administered 2015-01-26 (×2): 25 mL via ORAL

## 2015-01-26 MED ORDER — ENOXAPARIN SODIUM 30 MG/0.3ML ~~LOC~~ SOLN
30.0000 mg | SUBCUTANEOUS | Status: DC
  Administered 2015-01-26 – 2015-01-28 (×3): 30 mg via SUBCUTANEOUS
  Filled 2015-01-26 (×4): qty 0.3

## 2015-01-26 MED ORDER — MORPHINE SULFATE 2 MG/ML IJ SOLN
1.0000 mg | INTRAMUSCULAR | Status: DC | PRN
  Administered 2015-01-26 (×2): 1 mg via INTRAVENOUS
  Filled 2015-01-26 (×3): qty 1

## 2015-01-26 MED ORDER — FINASTERIDE 5 MG PO TABS
5.0000 mg | ORAL_TABLET | Freq: Every day | ORAL | Status: DC
Start: 2015-01-26 — End: 2015-01-29
  Administered 2015-01-26 – 2015-01-29 (×4): 5 mg via ORAL
  Filled 2015-01-26 (×4): qty 1

## 2015-01-26 MED ORDER — SODIUM CHLORIDE 0.9 % IJ SOLN
3.0000 mL | Freq: Two times a day (BID) | INTRAMUSCULAR | Status: DC
  Administered 2015-01-26 – 2015-01-28 (×6): 3 mL via INTRAVENOUS

## 2015-01-26 MED ORDER — PANTOPRAZOLE SODIUM 40 MG IV SOLR
40.0000 mg | INTRAVENOUS | Status: DC
  Administered 2015-01-26 – 2015-01-27 (×2): 40 mg via INTRAVENOUS
  Filled 2015-01-26 (×4): qty 40

## 2015-01-26 MED ORDER — IOHEXOL 300 MG/ML  SOLN
75.0000 mL | Freq: Once | INTRAMUSCULAR | Status: AC | PRN
  Administered 2015-01-26: 75 mL via INTRAVENOUS

## 2015-01-26 MED ORDER — ACETAMINOPHEN 650 MG RE SUPP: 650.0000 mg | Freq: Four times a day (QID) | RECTAL | Status: DC | PRN

## 2015-01-26 MED ORDER — ONDANSETRON HCL 4 MG PO TABS: 4.0000 mg | ORAL_TABLET | Freq: Four times a day (QID) | ORAL | Status: DC | PRN

## 2015-01-26 NOTE — H&P (Addendum)
[facility] Admission History and Physical       Brian Salinas ZOX:[id number] DOB: 1940/12/16 DOA: 01/25/2015  Referring physician: PCP: Sid Falcon, MD  Specialists:   Chief Complaint: Pain and Weakness in Both Legs  HPI: Brian Salinas is a 74 y.o. male with a history of Nephrolithiasis, BPH, Gout who was seen by Neurology in [facility] this AM due to weakness and stabbing pain and tingling in both of his legs for 2 weeks.  He was seen and referred to the ED for further evaluation since he had been unable to walk, and has had progressive decline and weight loss for the past month.   He has had poor intake of foods and liquids and a reported loss of 20 pounds in 30 days.   He was seen by Neurology Dr. Hosie Poisson in the ED and was sent for MRIs of the C-spine, T-Spine and LS Spine and referred for further evaluation.     Review of Systems: Unable to Obtain from the Patient  Past Medical History  Diagnosis Date  . Bladder troubles   . Gout   . History of kidney stones      Past Surgical History  Procedure Laterality Date  . No past surgeries    . Colonoscopy    . Cystoscopy with retrograde pyelogram, ureteroscopy and stent placement Right 11/30/2014    Procedure: CYSTOSCOPY WITH RETROGRADE PYELOGRAM, URETEROSCOPY AND STENT PLACEMENT RIGHT SIDE;  Surgeon: Sebastian Ache, MD;  Location: [facility];  Service: Urology;  Laterality: Right;  . Holmium laser application Right 11/30/2014    Procedure: HOLMIUM LASER APPLICATION;  Surgeon: Sebastian Ache, MD;  Location: [facility];  Service: Urology;  Laterality: Right;      Prior to Admission medications   Medication Sig Start Date End Date Taking? Authorizing Provider  finasteride (PROSCAR) 5 MG tablet Take 5 mg by mouth daily.   Yes Historical Provider, MD  senna-docusate (SENOKOT-S) 8.6-50 MG per tablet Take 1 tablet by mouth 2 (two) times daily. While taking pain meds to prevent constipation 11/30/14  Yes Sebastian Ache, MD    Tamsulosin HCl (FLOMAX) 0.4 MG CAPS Take 0.8 mg by mouth daily.   Yes Historical Provider, MD  cephALEXin (KEFLEX) 500 MG capsule Take 1 capsule (500 mg total) by mouth 2 (two) times daily. X 4 days to prevent post-op infection Patient not taking: Reported on 01/25/2015 11/30/14   Sebastian Ache, MD  oxyCODONE-acetaminophen (PERCOCET/ROXICET) 5-325 MG per tablet Take 1-2 tablets by mouth every 6 (six) hours as needed for moderate pain or severe pain. Post-operatively Patient not taking: Reported on 01/25/2015 11/30/14   Sebastian Ache, MD     No Known Allergies  Social History:  Widow x 7 years,  One of his Sons lives in his home, Has not been able to walk for more than 1 week, Unable to perform any of his ADLs  reports that he quit smoking about 17 years ago. His smoking use included Cigarettes. He quit after 20 years of use. He has never used smokeless tobacco. He reports that he does not drink alcohol or use illicit drugs.    Family History:    Cancer in Father  Prostate Cancer in Brother   Physical Exam:  GEN:  Pleasant Cachectic Frail  74 y.o. Caucasian male examined and in no acute distress; Filed Vitals:   01/26/15 0030 01/26/15 0045 01/26/15 0100 01/26/15 0115  BP: 87/59 113/67 117/69 114/63  Pulse: 66 69 68 74  Temp:  TempSrc:      Resp:  14 11   SpO2: 98% 99% 100% 98%   Blood pressure 114/63, pulse 74, temperature 97.4 F (36.3 C), temperature source Oral, resp. rate 11, SpO2 98 %. PSYCH: He is alert and oriented x 1; does not appear anxious does not appear depressed; affect is normal HEENT: Normocephalic and Atraumatic, Mucous membranes pink; PERRLA; EOM intact; Fundi:  Benign;  No scleral icterus, Nares: Patent, Oropharynx: Clear,   Neck:  FROM, No Cervical Lymphadenopathy nor Thyromegaly or Carotid Bruit; No JVD; Breasts:: Not examined CHEST WALL: No tenderness CHEST: Normal respiration, clear to auscultation bilaterally HEART: Regular rate and rhythm; no murmurs  rubs or gallops BACK: No kyphosis or scoliosis; No CVA tenderness ABDOMEN: Positive Bowel Sounds, Scaphoid, Soft Non-Tender, No Rebound or Guarding; No Masses, No Organomegaly Rectal Exam: Not done EXTREMITIES: No Cyanosis, Clubbing, or Edema; No Ulcerations. Genitalia: not examined PULSES: 2+ and symmetric SKIN: Normal hydration no rash or ulceration CNS:  Alert and Oriented x 1, Able to move all 4 Extremities,   No Focal Deficits,  MSK:   Decreased Bulk, Stiffness in Axial Joints,   Vascular: pulses palpable throughout    Labs on Admission:  Basic Metabolic Panel:  Recent Labs Lab 01/25/15 1500  NA 140  K 3.7  CL 98*  CO2 33*  GLUCOSE 125*  BUN 20  CREATININE 1.18  CALCIUM 10.6*   Liver Function Tests:  Recent Labs Lab 01/25/15 1500  AST 22  ALT 16*  ALKPHOS 67  BILITOT 0.9  PROT 7.7  ALBUMIN 3.8    Recent Labs Lab 01/25/15 1838  LIPASE 23   No results for input(s): AMMONIA in the last 168 hours. CBC:  Recent Labs Lab 01/25/15 1500  WBC 10.4  NEUTROABS 7.9*  HGB 15.4  HCT 44.6  MCV 87.3  PLT 225   Cardiac Enzymes: No results for input(s): CKTOTAL, CKMB, CKMBINDEX, TROPONINI in the last 168 hours.  BNP (last 3 results) No results for input(s): BNP in the last 8760 hours.  ProBNP (last 3 results) No results for input(s): PROBNP in the last 8760 hours.  CBG: No results for input(s): GLUCAP in the last 168 hours.  Radiological Exams on Admission: Ct Head Wo Contrast  01/25/2015   CLINICAL DATA:  Bilateral lower extremity numbness for 2-3 weeks.  EXAM: CT HEAD WITHOUT CONTRAST  TECHNIQUE: Contiguous axial images were obtained from the base of the skull through the vertex without intravenous contrast.  COMPARISON:  CT scan of August 05, 2012.  FINDINGS: Bony calvarium appears intact. Minimal diffuse cortical atrophy is noted. Mild chronic ischemic white matter disease is noted. No mass effect or midline shift is noted. Ventricular size is within  normal limits. There is no evidence of mass lesion, hemorrhage or acute infarction.  IMPRESSION: Minimal diffuse cortical atrophy. Mild chronic ischemic white matter disease. No acute intracranial abnormality seen.   Electronically Signed   By: Lupita Raider, M.D.   On: 01/25/2015 19:53   Mr Brain Wo Contrast  01/25/2015   CLINICAL DATA:  Initial evaluation for progressive decline, back pain, difficulty walking  EXAM: MRI HEAD WITHOUT CONTRAST  MRI CERVICAL SPINE WITHOUT CONTRAST  MRI THORACIC SPINE WITHOUT CONTRAST  MRI LUMBAR SPINE WITHOUT CONTRAST  TECHNIQUE: Multiplanar, multiecho pulse sequences of the brain and spine and surrounding structures were obtained without intravenous contrast.  COMPARISON:  Prior CT from earlier the same day.  FINDINGS: MRI BRAIN:  Study is degraded by motion artifact.  Diffuse prominence of the CSF containing spaces is compatible with generalized atrophy. Patchy and confluent T2/FLAIR hyperintensity within the periventricular and deep white matter both cerebral hemispheres most consistent with chronic small vessel ischemic changes.  No acute infarct. No mass lesion, midline shift, or mass effect. No hydrocephalus. No extra-axial fluid collection. Normal intravascular flow voids maintained. No acute or chronic intracranial hemorrhage.  Orbits within normal limits. Paranasal sinuses are clear. No mastoid effusion. Inner ear structures normal.  Bone marrow signal intensity within normal limits.  MRI CERVICAL SPINE:  Craniocervical junction is widely patent.  There is reversal of the normal cervical lordosis with apex at C3. There is 3 mm anterolisthesis of C2 on C3, with 3 mm retrolisthesis of C3 on C4. Trace anterolisthesis of C5 on C6 and C7 on T1.  Vertebral body heights are maintained. No fracture. Heterogeneous degenerative disc disease present about the C3-4 intervertebral disc space. Otherwise vertebral body bone marrow signal intensity is normal  Signal intensity within  the cervical spinal cord is normal. Spinal cord is kinked at the level of C3-4.  Paraspinous soft tissues within normal limits. Normal intravascular flow voids present within the vertebral arteries.  C2-C3: Mild diffuse degenerative disc osteophyte with bilateral uncovertebral spurring. There is resultant moderate right with mild left foraminal stenosis. Posterior disc osteophyte partially effaces the ventral thecal sac and results in mild canal stenosis.  C3-C4: Diffuse degenerative disc osteophyte with degenerative endplate changes, bilateral uncovertebral spurring, and facet arthrosis. There is resultant severe right with moderate to severe left foraminal stenosis. There is severe canal narrowing with flattening of the cervical spinal cord. Thecal sac measures 7.7 mm in AP diameter. No associated cord signal changes.  C4-C5: Diffuse degenerative disc osteophyte with bilateral uncovertebral spurring, greater on the right. Superimposed mild facet hypertrophy. There is severe right with moderate to severe left foraminal stenosis. Posterior disc osteophyte largely effaces the ventral thecal sac and results in moderate to severe canal stenosis. Mild flattening of the cervical spinal cord without cord signal changes.  C5-C6: Bilateral uncovertebral spurring with prominent right-sided facet arthrosis. There is resultant severe right foraminal narrowing. Mild to moderate left foraminal stenosis. Posterior disc osteophyte minimally effaces the ventral thecal sac with resultant mild canal stenosis.  C6-C7: Bilateral uncovertebral hypertrophy with mild facet arthrosis. Resultant moderate bilateral foraminal stenosis. Posterior disc osteophyte results in mild canal narrowing.  C7-T1: Bilateral uncovertebral hypertrophy with facet arthrosis. There is left greater than right mild canal narrowing. Left paracentral disc osteophyte minimally indents the ventral thecal sac with resultant mild canal narrowing.  MRI THORACIC SPINE:   Vertebral bodies are normally aligned with preservation of the normal thoracic kyphosis. No listhesis.  Chronic compression deformity of the T9 vertebral body noted. Associated degenerative endplate Schmorl's node present at the superior endplate of T9. Otherwise, vertebral body heights are maintained. No acute fracture. No marrow edema. No focal osseous lesion.  Signal intensity within the thoracic spinal cord is normal.  Paraspinous soft tissues are within normal limits. Visualized lungs are grossly clear.  Tiny left paracentral disc protrusion at T5-6 without significant stenosis. Tiny central disc protrusion at T7-8 without stenosis. Minimal disc bulge at T8-9 without stenosis. Shallow right foraminal disc protrusion at T9-10 without stenosis or neural impingement. Mild disc bulge at T10-11 without significant stenosis.  MRI LUMBAR SPINE:  In correlation with the thoracic spine, there appears to be transitional lumbosacral anatomy with partial lumbarization of the S1 segment. Vertebral bodies are normally aligned with preservation of the  normal lumbar lordosis. Vertebral body heights are maintained. No acute fracture. No bone marrow edema. No focal osseous lesion.  Conus medullaris terminates normally at the L1-2 level. Signal intensity within the visualized cord is normal.  Paraspinous soft tissues demonstrate no acute abnormality. Visualized intra-abdominal aorta within normal limits. T2 hyperintense cyst partially visualized within the left kidney.  L1-2: Degenerative disc desiccation present. Superimposed small central disc extrusion indents the ventral thecal sac without significant canal stenosis. Foramina remain widely patent.  L2-3: Degenerative disc desiccation with intervertebral disc space narrowing and mild diffuse disc bulge. Anterior endplate osteophytic spurring. No significant canal stenosis. Foramina remain widely patent.  L3-4: Mild diffuse disc bulge with disc desiccation. No focal disc  herniation. No significant facet arthropathy. No canal or foraminal stenosis.  L4-5: Diffuse degenerative disc bulge with disc desiccation. No definite focal disc herniation. Superimposed facet and ligamentous hypertrophy. There is resultant mild canal and right foraminal narrowing. Left neural foramen remains widely patent.  L5-S1: Degenerative disc desiccation with disc bulge. Superimposed shallow broad-based disc protrusion, slightly eccentric to the left. Superimposed facet and ligamentous hypertrophy. Protruding disc appears to contact the transiting left S1 nerve root as it courses through the lateral recess, and could potentially result in left lower extremity radiculopathy. There is mild canal and bilateral foraminal narrowing at this level.  IMPRESSION: MRI BRAIN:  1. No acute intracranial infarct or other process identified. 2. Generalized cerebral atrophy with mild to moderate chronic small vessel ischemic changes.  MRI CERVICAL SPINE:  1. Reversal of the normal cervical lordosis with apex at C3, resulting in severe canal stenosis at C3-4 and C4-5 with kinking of the spinal cord at these levels. No associated cord signal changes. 2. Additional multilevel degenerative disc disease with resultant foraminal narrowing as detailed above.  MRI THORACIC SPINE:  1. No acute abnormality within the thoracic spine. 2. Chronic compression deformity of the T9 vertebral body without significant retropulsion. 3. Mild multilevel degenerative disc disease as detailed above. No significant canal or foraminal stenosis.  MRI LUMBAR SPINE:  1. No acute abnormality within the lumbar spine. No significant canal stenosis. 2. Shallow posterior disc protrusion at L5-S1, closely approximating the transiting left S1 nerve root in the left lateral recess. This could potentially result in left lower extremity radicular symptoms. 3. Small central disc extrusion at L1-2 without significant stenosis. 4. Additional mild degenerative  changes as above. 5. Transitional lumbosacral anatomy. Careful correlation with imaging and plain film radiography recommended prior to any potential future planned surgical intervention.   Electronically Signed   By: Rise Mu M.D.   On: 01/25/2015 23:40   Mr Cervical Spine Wo Contrast  01/25/2015   CLINICAL DATA:  Initial evaluation for progressive decline, back pain, difficulty walking  EXAM: MRI HEAD WITHOUT CONTRAST  MRI CERVICAL SPINE WITHOUT CONTRAST  MRI THORACIC SPINE WITHOUT CONTRAST  MRI LUMBAR SPINE WITHOUT CONTRAST  TECHNIQUE: Multiplanar, multiecho pulse sequences of the brain and spine and surrounding structures were obtained without intravenous contrast.  COMPARISON:  Prior CT from earlier the same day.  FINDINGS: MRI BRAIN:  Study is degraded by motion artifact.  Diffuse prominence of the CSF containing spaces is compatible with generalized atrophy. Patchy and confluent T2/FLAIR hyperintensity within the periventricular and deep white matter both cerebral hemispheres most consistent with chronic small vessel ischemic changes.  No acute infarct. No mass lesion, midline shift, or mass effect. No hydrocephalus. No extra-axial fluid collection. Normal intravascular flow voids maintained. No acute or chronic intracranial  hemorrhage.  Orbits within normal limits. Paranasal sinuses are clear. No mastoid effusion. Inner ear structures normal.  Bone marrow signal intensity within normal limits.  MRI CERVICAL SPINE:  Craniocervical junction is widely patent.  There is reversal of the normal cervical lordosis with apex at C3. There is 3 mm anterolisthesis of C2 on C3, with 3 mm retrolisthesis of C3 on C4. Trace anterolisthesis of C5 on C6 and C7 on T1.  Vertebral body heights are maintained. No fracture. Heterogeneous degenerative disc disease present about the C3-4 intervertebral disc space. Otherwise vertebral body bone marrow signal intensity is normal  Signal intensity within the cervical  spinal cord is normal. Spinal cord is kinked at the level of C3-4.  Paraspinous soft tissues within normal limits. Normal intravascular flow voids present within the vertebral arteries.  C2-C3: Mild diffuse degenerative disc osteophyte with bilateral uncovertebral spurring. There is resultant moderate right with mild left foraminal stenosis. Posterior disc osteophyte partially effaces the ventral thecal sac and results in mild canal stenosis.  C3-C4: Diffuse degenerative disc osteophyte with degenerative endplate changes, bilateral uncovertebral spurring, and facet arthrosis. There is resultant severe right with moderate to severe left foraminal stenosis. There is severe canal narrowing with flattening of the cervical spinal cord. Thecal sac measures 7.7 mm in AP diameter. No associated cord signal changes.  C4-C5: Diffuse degenerative disc osteophyte with bilateral uncovertebral spurring, greater on the right. Superimposed mild facet hypertrophy. There is severe right with moderate to severe left foraminal stenosis. Posterior disc osteophyte largely effaces the ventral thecal sac and results in moderate to severe canal stenosis. Mild flattening of the cervical spinal cord without cord signal changes.  C5-C6: Bilateral uncovertebral spurring with prominent right-sided facet arthrosis. There is resultant severe right foraminal narrowing. Mild to moderate left foraminal stenosis. Posterior disc osteophyte minimally effaces the ventral thecal sac with resultant mild canal stenosis.  C6-C7: Bilateral uncovertebral hypertrophy with mild facet arthrosis. Resultant moderate bilateral foraminal stenosis. Posterior disc osteophyte results in mild canal narrowing.  C7-T1: Bilateral uncovertebral hypertrophy with facet arthrosis. There is left greater than right mild canal narrowing. Left paracentral disc osteophyte minimally indents the ventral thecal sac with resultant mild canal narrowing.  MRI THORACIC SPINE:  Vertebral  bodies are normally aligned with preservation of the normal thoracic kyphosis. No listhesis.  Chronic compression deformity of the T9 vertebral body noted. Associated degenerative endplate Schmorl's node present at the superior endplate of T9. Otherwise, vertebral body heights are maintained. No acute fracture. No marrow edema. No focal osseous lesion.  Signal intensity within the thoracic spinal cord is normal.  Paraspinous soft tissues are within normal limits. Visualized lungs are grossly clear.  Tiny left paracentral disc protrusion at T5-6 without significant stenosis. Tiny central disc protrusion at T7-8 without stenosis. Minimal disc bulge at T8-9 without stenosis. Shallow right foraminal disc protrusion at T9-10 without stenosis or neural impingement. Mild disc bulge at T10-11 without significant stenosis.  MRI LUMBAR SPINE:  In correlation with the thoracic spine, there appears to be transitional lumbosacral anatomy with partial lumbarization of the S1 segment. Vertebral bodies are normally aligned with preservation of the normal lumbar lordosis. Vertebral body heights are maintained. No acute fracture. No bone marrow edema. No focal osseous lesion.  Conus medullaris terminates normally at the L1-2 level. Signal intensity within the visualized cord is normal.  Paraspinous soft tissues demonstrate no acute abnormality. Visualized intra-abdominal aorta within normal limits. T2 hyperintense cyst partially visualized within the left kidney.  L1-2: Degenerative disc  desiccation present. Superimposed small central disc extrusion indents the ventral thecal sac without significant canal stenosis. Foramina remain widely patent.  L2-3: Degenerative disc desiccation with intervertebral disc space narrowing and mild diffuse disc bulge. Anterior endplate osteophytic spurring. No significant canal stenosis. Foramina remain widely patent.  L3-4: Mild diffuse disc bulge with disc desiccation. No focal disc herniation. No  significant facet arthropathy. No canal or foraminal stenosis.  L4-5: Diffuse degenerative disc bulge with disc desiccation. No definite focal disc herniation. Superimposed facet and ligamentous hypertrophy. There is resultant mild canal and right foraminal narrowing. Left neural foramen remains widely patent.  L5-S1: Degenerative disc desiccation with disc bulge. Superimposed shallow broad-based disc protrusion, slightly eccentric to the left. Superimposed facet and ligamentous hypertrophy. Protruding disc appears to contact the transiting left S1 nerve root as it courses through the lateral recess, and could potentially result in left lower extremity radiculopathy. There is mild canal and bilateral foraminal narrowing at this level.  IMPRESSION: MRI BRAIN:  1. No acute intracranial infarct or other process identified. 2. Generalized cerebral atrophy with mild to moderate chronic small vessel ischemic changes.  MRI CERVICAL SPINE:  1. Reversal of the normal cervical lordosis with apex at C3, resulting in severe canal stenosis at C3-4 and C4-5 with kinking of the spinal cord at these levels. No associated cord signal changes. 2. Additional multilevel degenerative disc disease with resultant foraminal narrowing as detailed above.  MRI THORACIC SPINE:  1. No acute abnormality within the thoracic spine. 2. Chronic compression deformity of the T9 vertebral body without significant retropulsion. 3. Mild multilevel degenerative disc disease as detailed above. No significant canal or foraminal stenosis.  MRI LUMBAR SPINE:  1. No acute abnormality within the lumbar spine. No significant canal stenosis. 2. Shallow posterior disc protrusion at L5-S1, closely approximating the transiting left S1 nerve root in the left lateral recess. This could potentially result in left lower extremity radicular symptoms. 3. Small central disc extrusion at L1-2 without significant stenosis. 4. Additional mild degenerative changes as above. 5.  Transitional lumbosacral anatomy. Careful correlation with imaging and plain film radiography recommended prior to any potential future planned surgical intervention.   Electronically Signed   By: Rise Mu M.D.   On: 01/25/2015 23:40   Mr Thoracic Spine Wo Contrast  01/25/2015   CLINICAL DATA:  Initial evaluation for progressive decline, back pain, difficulty walking  EXAM: MRI HEAD WITHOUT CONTRAST  MRI CERVICAL SPINE WITHOUT CONTRAST  MRI THORACIC SPINE WITHOUT CONTRAST  MRI LUMBAR SPINE WITHOUT CONTRAST  TECHNIQUE: Multiplanar, multiecho pulse sequences of the brain and spine and surrounding structures were obtained without intravenous contrast.  COMPARISON:  Prior CT from earlier the same day.  FINDINGS: MRI BRAIN:  Study is degraded by motion artifact.  Diffuse prominence of the CSF containing spaces is compatible with generalized atrophy. Patchy and confluent T2/FLAIR hyperintensity within the periventricular and deep white matter both cerebral hemispheres most consistent with chronic small vessel ischemic changes.  No acute infarct. No mass lesion, midline shift, or mass effect. No hydrocephalus. No extra-axial fluid collection. Normal intravascular flow voids maintained. No acute or chronic intracranial hemorrhage.  Orbits within normal limits. Paranasal sinuses are clear. No mastoid effusion. Inner ear structures normal.  Bone marrow signal intensity within normal limits.  MRI CERVICAL SPINE:  Craniocervical junction is widely patent.  There is reversal of the normal cervical lordosis with apex at C3. There is 3 mm anterolisthesis of C2 on C3, with 3 mm retrolisthesis of C3  on C4. Trace anterolisthesis of C5 on C6 and C7 on T1.  Vertebral body heights are maintained. No fracture. Heterogeneous degenerative disc disease present about the C3-4 intervertebral disc space. Otherwise vertebral body bone marrow signal intensity is normal  Signal intensity within the cervical spinal cord is normal.  Spinal cord is kinked at the level of C3-4.  Paraspinous soft tissues within normal limits. Normal intravascular flow voids present within the vertebral arteries.  C2-C3: Mild diffuse degenerative disc osteophyte with bilateral uncovertebral spurring. There is resultant moderate right with mild left foraminal stenosis. Posterior disc osteophyte partially effaces the ventral thecal sac and results in mild canal stenosis.  C3-C4: Diffuse degenerative disc osteophyte with degenerative endplate changes, bilateral uncovertebral spurring, and facet arthrosis. There is resultant severe right with moderate to severe left foraminal stenosis. There is severe canal narrowing with flattening of the cervical spinal cord. Thecal sac measures 7.7 mm in AP diameter. No associated cord signal changes.  C4-C5: Diffuse degenerative disc osteophyte with bilateral uncovertebral spurring, greater on the right. Superimposed mild facet hypertrophy. There is severe right with moderate to severe left foraminal stenosis. Posterior disc osteophyte largely effaces the ventral thecal sac and results in moderate to severe canal stenosis. Mild flattening of the cervical spinal cord without cord signal changes.  C5-C6: Bilateral uncovertebral spurring with prominent right-sided facet arthrosis. There is resultant severe right foraminal narrowing. Mild to moderate left foraminal stenosis. Posterior disc osteophyte minimally effaces the ventral thecal sac with resultant mild canal stenosis.  C6-C7: Bilateral uncovertebral hypertrophy with mild facet arthrosis. Resultant moderate bilateral foraminal stenosis. Posterior disc osteophyte results in mild canal narrowing.  C7-T1: Bilateral uncovertebral hypertrophy with facet arthrosis. There is left greater than right mild canal narrowing. Left paracentral disc osteophyte minimally indents the ventral thecal sac with resultant mild canal narrowing.  MRI THORACIC SPINE:  Vertebral bodies are normally  aligned with preservation of the normal thoracic kyphosis. No listhesis.  Chronic compression deformity of the T9 vertebral body noted. Associated degenerative endplate Schmorl's node present at the superior endplate of T9. Otherwise, vertebral body heights are maintained. No acute fracture. No marrow edema. No focal osseous lesion.  Signal intensity within the thoracic spinal cord is normal.  Paraspinous soft tissues are within normal limits. Visualized lungs are grossly clear.  Tiny left paracentral disc protrusion at T5-6 without significant stenosis. Tiny central disc protrusion at T7-8 without stenosis. Minimal disc bulge at T8-9 without stenosis. Shallow right foraminal disc protrusion at T9-10 without stenosis or neural impingement. Mild disc bulge at T10-11 without significant stenosis.  MRI LUMBAR SPINE:  In correlation with the thoracic spine, there appears to be transitional lumbosacral anatomy with partial lumbarization of the S1 segment. Vertebral bodies are normally aligned with preservation of the normal lumbar lordosis. Vertebral body heights are maintained. No acute fracture. No bone marrow edema. No focal osseous lesion.  Conus medullaris terminates normally at the L1-2 level. Signal intensity within the visualized cord is normal.  Paraspinous soft tissues demonstrate no acute abnormality. Visualized intra-abdominal aorta within normal limits. T2 hyperintense cyst partially visualized within the left kidney.  L1-2: Degenerative disc desiccation present. Superimposed small central disc extrusion indents the ventral thecal sac without significant canal stenosis. Foramina remain widely patent.  L2-3: Degenerative disc desiccation with intervertebral disc space narrowing and mild diffuse disc bulge. Anterior endplate osteophytic spurring. No significant canal stenosis. Foramina remain widely patent.  L3-4: Mild diffuse disc bulge with disc desiccation. No focal disc herniation. No significant facet  arthropathy. No canal or foraminal stenosis.  L4-5: Diffuse degenerative disc bulge with disc desiccation. No definite focal disc herniation. Superimposed facet and ligamentous hypertrophy. There is resultant mild canal and right foraminal narrowing. Left neural foramen remains widely patent.  L5-S1: Degenerative disc desiccation with disc bulge. Superimposed shallow broad-based disc protrusion, slightly eccentric to the left. Superimposed facet and ligamentous hypertrophy. Protruding disc appears to contact the transiting left S1 nerve root as it courses through the lateral recess, and could potentially result in left lower extremity radiculopathy. There is mild canal and bilateral foraminal narrowing at this level.  IMPRESSION: MRI BRAIN:  1. No acute intracranial infarct or other process identified. 2. Generalized cerebral atrophy with mild to moderate chronic small vessel ischemic changes.  MRI CERVICAL SPINE:  1. Reversal of the normal cervical lordosis with apex at C3, resulting in severe canal stenosis at C3-4 and C4-5 with kinking of the spinal cord at these levels. No associated cord signal changes. 2. Additional multilevel degenerative disc disease with resultant foraminal narrowing as detailed above.  MRI THORACIC SPINE:  1. No acute abnormality within the thoracic spine. 2. Chronic compression deformity of the T9 vertebral body without significant retropulsion. 3. Mild multilevel degenerative disc disease as detailed above. No significant canal or foraminal stenosis.  MRI LUMBAR SPINE:  1. No acute abnormality within the lumbar spine. No significant canal stenosis. 2. Shallow posterior disc protrusion at L5-S1, closely approximating the transiting left S1 nerve root in the left lateral recess. This could potentially result in left lower extremity radicular symptoms. 3. Small central disc extrusion at L1-2 without significant stenosis. 4. Additional mild degenerative changes as above. 5. Transitional  lumbosacral anatomy. Careful correlation with imaging and plain film radiography recommended prior to any potential future planned surgical intervention.   Electronically Signed   By: Rise Mu M.D.   On: 01/25/2015 23:40   Mr Lumbar Spine Wo Contrast  01/25/2015   CLINICAL DATA:  Initial evaluation for progressive decline, back pain, difficulty walking  EXAM: MRI HEAD WITHOUT CONTRAST  MRI CERVICAL SPINE WITHOUT CONTRAST  MRI THORACIC SPINE WITHOUT CONTRAST  MRI LUMBAR SPINE WITHOUT CONTRAST  TECHNIQUE: Multiplanar, multiecho pulse sequences of the brain and spine and surrounding structures were obtained without intravenous contrast.  COMPARISON:  Prior CT from earlier the same day.  FINDINGS: MRI BRAIN:  Study is degraded by motion artifact.  Diffuse prominence of the CSF containing spaces is compatible with generalized atrophy. Patchy and confluent T2/FLAIR hyperintensity within the periventricular and deep white matter both cerebral hemispheres most consistent with chronic small vessel ischemic changes.  No acute infarct. No mass lesion, midline shift, or mass effect. No hydrocephalus. No extra-axial fluid collection. Normal intravascular flow voids maintained. No acute or chronic intracranial hemorrhage.  Orbits within normal limits. Paranasal sinuses are clear. No mastoid effusion. Inner ear structures normal.  Bone marrow signal intensity within normal limits.  MRI CERVICAL SPINE:  Craniocervical junction is widely patent.  There is reversal of the normal cervical lordosis with apex at C3. There is 3 mm anterolisthesis of C2 on C3, with 3 mm retrolisthesis of C3 on C4. Trace anterolisthesis of C5 on C6 and C7 on T1.  Vertebral body heights are maintained. No fracture. Heterogeneous degenerative disc disease present about the C3-4 intervertebral disc space. Otherwise vertebral body bone marrow signal intensity is normal  Signal intensity within the cervical spinal cord is normal. Spinal cord is  kinked at the level of C3-4.  Paraspinous soft tissues  within normal limits. Normal intravascular flow voids present within the vertebral arteries.  C2-C3: Mild diffuse degenerative disc osteophyte with bilateral uncovertebral spurring. There is resultant moderate right with mild left foraminal stenosis. Posterior disc osteophyte partially effaces the ventral thecal sac and results in mild canal stenosis.  C3-C4: Diffuse degenerative disc osteophyte with degenerative endplate changes, bilateral uncovertebral spurring, and facet arthrosis. There is resultant severe right with moderate to severe left foraminal stenosis. There is severe canal narrowing with flattening of the cervical spinal cord. Thecal sac measures 7.7 mm in AP diameter. No associated cord signal changes.  C4-C5: Diffuse degenerative disc osteophyte with bilateral uncovertebral spurring, greater on the right. Superimposed mild facet hypertrophy. There is severe right with moderate to severe left foraminal stenosis. Posterior disc osteophyte largely effaces the ventral thecal sac and results in moderate to severe canal stenosis. Mild flattening of the cervical spinal cord without cord signal changes.  C5-C6: Bilateral uncovertebral spurring with prominent right-sided facet arthrosis. There is resultant severe right foraminal narrowing. Mild to moderate left foraminal stenosis. Posterior disc osteophyte minimally effaces the ventral thecal sac with resultant mild canal stenosis.  C6-C7: Bilateral uncovertebral hypertrophy with mild facet arthrosis. Resultant moderate bilateral foraminal stenosis. Posterior disc osteophyte results in mild canal narrowing.  C7-T1: Bilateral uncovertebral hypertrophy with facet arthrosis. There is left greater than right mild canal narrowing. Left paracentral disc osteophyte minimally indents the ventral thecal sac with resultant mild canal narrowing.  MRI THORACIC SPINE:  Vertebral bodies are normally aligned with  preservation of the normal thoracic kyphosis. No listhesis.  Chronic compression deformity of the T9 vertebral body noted. Associated degenerative endplate Schmorl's node present at the superior endplate of T9. Otherwise, vertebral body heights are maintained. No acute fracture. No marrow edema. No focal osseous lesion.  Signal intensity within the thoracic spinal cord is normal.  Paraspinous soft tissues are within normal limits. Visualized lungs are grossly clear.  Tiny left paracentral disc protrusion at T5-6 without significant stenosis. Tiny central disc protrusion at T7-8 without stenosis. Minimal disc bulge at T8-9 without stenosis. Shallow right foraminal disc protrusion at T9-10 without stenosis or neural impingement. Mild disc bulge at T10-11 without significant stenosis.  MRI LUMBAR SPINE:  In correlation with the thoracic spine, there appears to be transitional lumbosacral anatomy with partial lumbarization of the S1 segment. Vertebral bodies are normally aligned with preservation of the normal lumbar lordosis. Vertebral body heights are maintained. No acute fracture. No bone marrow edema. No focal osseous lesion.  Conus medullaris terminates normally at the L1-2 level. Signal intensity within the visualized cord is normal.  Paraspinous soft tissues demonstrate no acute abnormality. Visualized intra-abdominal aorta within normal limits. T2 hyperintense cyst partially visualized within the left kidney.  L1-2: Degenerative disc desiccation present. Superimposed small central disc extrusion indents the ventral thecal sac without significant canal stenosis. Foramina remain widely patent.  L2-3: Degenerative disc desiccation with intervertebral disc space narrowing and mild diffuse disc bulge. Anterior endplate osteophytic spurring. No significant canal stenosis. Foramina remain widely patent.  L3-4: Mild diffuse disc bulge with disc desiccation. No focal disc herniation. No significant facet arthropathy. No  canal or foraminal stenosis.  L4-5: Diffuse degenerative disc bulge with disc desiccation. No definite focal disc herniation. Superimposed facet and ligamentous hypertrophy. There is resultant mild canal and right foraminal narrowing. Left neural foramen remains widely patent.  L5-S1: Degenerative disc desiccation with disc bulge. Superimposed shallow broad-based disc protrusion, slightly eccentric to the left. Superimposed facet and ligamentous hypertrophy.  Protruding disc appears to contact the transiting left S1 nerve root as it courses through the lateral recess, and could potentially result in left lower extremity radiculopathy. There is mild canal and bilateral foraminal narrowing at this level.  IMPRESSION: MRI BRAIN:  1. No acute intracranial infarct or other process identified. 2. Generalized cerebral atrophy with mild to moderate chronic small vessel ischemic changes.  MRI CERVICAL SPINE:  1. Reversal of the normal cervical lordosis with apex at C3, resulting in severe canal stenosis at C3-4 and C4-5 with kinking of the spinal cord at these levels. No associated cord signal changes. 2. Additional multilevel degenerative disc disease with resultant foraminal narrowing as detailed above.  MRI THORACIC SPINE:  1. No acute abnormality within the thoracic spine. 2. Chronic compression deformity of the T9 vertebral body without significant retropulsion. 3. Mild multilevel degenerative disc disease as detailed above. No significant canal or foraminal stenosis.  MRI LUMBAR SPINE:  1. No acute abnormality within the lumbar spine. No significant canal stenosis. 2. Shallow posterior disc protrusion at L5-S1, closely approximating the transiting left S1 nerve root in the left lateral recess. This could potentially result in left lower extremity radicular symptoms. 3. Small central disc extrusion at L1-2 without significant stenosis. 4. Additional mild degenerative changes as above. 5. Transitional lumbosacral anatomy.  Careful correlation with imaging and plain film radiography recommended prior to any potential future planned surgical intervention.   Electronically Signed   By: Rise Mu M.D.   On: 01/25/2015 23:40     EKG: Independently reviewed. Sinus Rhythm 71        Assessment/Plan:  74 y.o. male with  Principal Problem:   1.   Weakness of both legs- due to DDD, and Malnutrition   Neurology Consulted and Following   MRIs done of C-Spine through L-S Spine    Revealing C-Spine Kink, Compression Fx of T-9, and DDD and Disk protrusions L5-S1   Check TSH, B12, Folate, HIV   Physical Therapy Evaluation   Rehab Rx referral options      Active Problems:   2.    Cervical Spine Kink on MRI   NeuroSurgery On call recommended Outpatient Referral to NeuroSurgery     3    Adult failure to thrive/Severe protein-calorie malnutrition   Nutriton consult for Caloric Needs   Initiate Clear Liquid Diet and advance as Tolerated       4.    Hypotension- after IV Ativan given, resolved  ` Monitor BPs       5.    Hypercalcemia- Most likely due to Immobilization   IVFs for rehydration   Monitor Calcium levels     6.    BPH (benign prostatic hyperplasia)   Continue Flomax   Continue Finasteride     7.    DVT Prophylaxis    Lovenox     Code Status:     FULL CODE        Family Communication:   Son at Bedside, spoke to Daughter [facility]) by phone   Disposition Plan:    Inpatient  Status       Time spent: 22 Minutes     Ron Parker [facility] Pager [phone]   If 7AM -7PM Please Contact the Day Rounding Team MD for [facility]  If 7PM-7AM, Please Contact Night-Floor Coverage  [email or web address] Password [email or web address] 01/26/2015, 1:57 AM     ADDENDUM:   Patient was seen and examined on 01/26/2015

## 2015-01-26 NOTE — Progress Notes (Signed)
[facility]                                                                              Patient Demographics  Brian Salinas, is a 74 y.o. male, DOB - 1940/12/31, ZOX:[id number]  Admit date - 01/25/2015   Admitting Physician Ron Parker, MD  Outpatient Primary MD for the patient is Sid Falcon, MD  LOS - 0   Chief Complaint  Patient presents with  . Back Pain  . Leg Pain  . Constipation       Brief HPI   Brian Salinas is a 74 y.o. male with a history of Nephrolithiasis, BPH, Gout who was seen by Neurology in [facility] this AM due to weakness and stabbing pain and tingling in both of his legs for 2 weeks. He was seen and referred to the ED for further evaluation since he had been unable to walk, and has had progressive decline and weight loss for the past month. He has had poor intake of foods and liquids and a reported loss of 20 pounds in 30 days. He was seen by Neurology Dr. Hosie Poisson in the ED and was sent for MRIs of the C-spine, T-Spine and LS Spine and referred for further evaluation.    Assessment & Plan    Principal Problem:   Weakness of both legs - Unclear etiology likely due to degenerative disc disease, malnutrition - Neurology consulted, MRI of the C-spine, through lumbar spine done, showed C-Spine Kink, Compression Fx of T-9, and DDD and Disk protrusions L5-S1 - TSH 1.67, B12 normal -  neurosurgery on call recommended outpatient referral to neurosurgery, in no acute neurosurgical intervention needed  - PT evaluation   Active Problems:    Adult failure to thrive, Severe protein-calorie malnutrition - Advance diet as tolerated, swallow evaluation, PTOT evaluation  - Nutritional supplements    Hypotension - Continue gentle hydration, IV fluids     Hypercalcemia - Currently improving, continue IV fluid hydration     BPH (benign prostatic hyperplasia) -Continue Proscar and Flomax   Code Status: Full code  Family  Communication: Discussed in detail with the patient, all imaging results, lab results explained to the patient    Disposition Plan : Likely will need skilled nursing facility, PTOT evaluation pending   Time Spent in minutes  25 minutes  Procedures  Mri   Consults   Neuro  DVT Prophylaxis   Lovenox  Medications  Scheduled Meds: . enoxaparin (LOVENOX) injection  30 mg Subcutaneous Q24H  . feeding supplement (RESOURCE BREEZE)  1 Container Oral TID BM  . finasteride  5 mg Oral Daily  . pantoprazole (PROTONIX) IV  40 mg Intravenous Q24H  . senna-docusate  1 tablet Oral BID  . sodium chloride  3 mL Intravenous Q12H  . tamsulosin  0.8 mg Oral Daily   Continuous Infusions: . sodium chloride 75 mL/hr at 01/26/15 0332   PRN Meds:.acetaminophen **OR** acetaminophen, alum & mag hydroxide-simeth, morphine injection, ondansetron **OR** ondansetron (ZOFRAN) IV, oxyCODONE   Antibiotics   Anti-infectives    None        Subjective:   Brian Salinas  was seen and examined today.   poor historian, unable to obtain review of system, alert and awake. Denies any pain, nausea or vomiting.  No acute events overnight.    Objective:   Blood pressure 127/77, pulse 83, temperature 97.3 F (36.3 C), temperature source Oral, resp. rate 18, height 5\' 4"  (1.626 m), weight 48.988 kg (108 lb), SpO2 98 %.  Wt Readings from Last 3 Encounters:  01/26/15 48.988 kg (108 lb)  11/30/14 50.009 kg (110 lb 4 oz)  08/08/12 49.17 kg (108 lb 6.4 oz)     Intake/Output Summary (Last 24 hours) at 01/26/15 1318 Last data filed at 01/26/15 1100  Gross per 24 hour  Intake    240 ml  Output    795 ml  Net   -555 ml    Exam  General: Alert and oriented x 1, NAD  HEENT:  PERRLA, EOMI, Anicteric Sclera, mucous membranes moist.   Neck: Supple, no JVD, no masses  CVS: S1 S2 auscultated, no rubs, murmurs or gallops. Regular rate and rhythm.  Respiratory: Clear to auscultation bilaterally, no wheezing,  rales or rhonchi  Abdomen: Soft, nontender, nondistended, + bowel sounds  Ext: no cyanosis clubbing or edema  Neuro: AAOx1, Cr N's II- XII. Strength 4/5 upper and lower extremities bilaterally, stiffness  Skin: No rashes  Psych: Normal affect and demeanor, alert and oriented x1   Data Review   Micro Results No results found for this or any previous visit (from the past 240 hour(s)).  Radiology Reports Ct Head Wo Contrast  01/25/2015   CLINICAL DATA:  Bilateral lower extremity numbness for 2-3 weeks.  EXAM: CT HEAD WITHOUT CONTRAST  TECHNIQUE: Contiguous axial images were obtained from the base of the skull through the vertex without intravenous contrast.  COMPARISON:  CT scan of August 05, 2012.  FINDINGS: Bony calvarium appears intact. Minimal diffuse cortical atrophy is noted. Mild chronic ischemic white matter disease is noted. No mass effect or midline shift is noted. Ventricular size is within normal limits. There is no evidence of mass lesion, hemorrhage or acute infarction.  IMPRESSION: Minimal diffuse cortical atrophy. Mild chronic ischemic white matter disease. No acute intracranial abnormality seen.   Electronically Signed   By: Lupita Raider, M.D.   On: 01/25/2015 19:53   Mr Brain Wo Contrast  01/25/2015   CLINICAL DATA:  Initial evaluation for progressive decline, back pain, difficulty walking  EXAM: MRI HEAD WITHOUT CONTRAST  MRI CERVICAL SPINE WITHOUT CONTRAST  MRI THORACIC SPINE WITHOUT CONTRAST  MRI LUMBAR SPINE WITHOUT CONTRAST  TECHNIQUE: Multiplanar, multiecho pulse sequences of the brain and spine and surrounding structures were obtained without intravenous contrast.  COMPARISON:  Prior CT from earlier the same day.  FINDINGS: MRI BRAIN:  Study is degraded by motion artifact.  Diffuse prominence of the CSF containing spaces is compatible with generalized atrophy. Patchy and confluent T2/FLAIR hyperintensity within the periventricular and deep white matter both cerebral  hemispheres most consistent with chronic small vessel ischemic changes.  No acute infarct. No mass lesion, midline shift, or mass effect. No hydrocephalus. No extra-axial fluid collection. Normal intravascular flow voids maintained. No acute or chronic intracranial hemorrhage.  Orbits within normal limits. Paranasal sinuses are clear. No mastoid effusion. Inner ear structures normal.  Bone marrow signal intensity within normal limits.  MRI CERVICAL SPINE:  Craniocervical junction is widely patent.  There is reversal of the normal cervical lordosis with apex at C3. There is 3 mm anterolisthesis of C2 on C3,  with 3 mm retrolisthesis of C3 on C4. Trace anterolisthesis of C5 on C6 and C7 on T1.  Vertebral body heights are maintained. No fracture. Heterogeneous degenerative disc disease present about the C3-4 intervertebral disc space. Otherwise vertebral body bone marrow signal intensity is normal  Signal intensity within the cervical spinal cord is normal. Spinal cord is kinked at the level of C3-4.  Paraspinous soft tissues within normal limits. Normal intravascular flow voids present within the vertebral arteries.  C2-C3: Mild diffuse degenerative disc osteophyte with bilateral uncovertebral spurring. There is resultant moderate right with mild left foraminal stenosis. Posterior disc osteophyte partially effaces the ventral thecal sac and results in mild canal stenosis.  C3-C4: Diffuse degenerative disc osteophyte with degenerative endplate changes, bilateral uncovertebral spurring, and facet arthrosis. There is resultant severe right with moderate to severe left foraminal stenosis. There is severe canal narrowing with flattening of the cervical spinal cord. Thecal sac measures 7.7 mm in AP diameter. No associated cord signal changes.  C4-C5: Diffuse degenerative disc osteophyte with bilateral uncovertebral spurring, greater on the right. Superimposed mild facet hypertrophy. There is severe right with moderate to  severe left foraminal stenosis. Posterior disc osteophyte largely effaces the ventral thecal sac and results in moderate to severe canal stenosis. Mild flattening of the cervical spinal cord without cord signal changes.  C5-C6: Bilateral uncovertebral spurring with prominent right-sided facet arthrosis. There is resultant severe right foraminal narrowing. Mild to moderate left foraminal stenosis. Posterior disc osteophyte minimally effaces the ventral thecal sac with resultant mild canal stenosis.  C6-C7: Bilateral uncovertebral hypertrophy with mild facet arthrosis. Resultant moderate bilateral foraminal stenosis. Posterior disc osteophyte results in mild canal narrowing.  C7-T1: Bilateral uncovertebral hypertrophy with facet arthrosis. There is left greater than right mild canal narrowing. Left paracentral disc osteophyte minimally indents the ventral thecal sac with resultant mild canal narrowing.  MRI THORACIC SPINE:  Vertebral bodies are normally aligned with preservation of the normal thoracic kyphosis. No listhesis.  Chronic compression deformity of the T9 vertebral body noted. Associated degenerative endplate Schmorl's node present at the superior endplate of T9. Otherwise, vertebral body heights are maintained. No acute fracture. No marrow edema. No focal osseous lesion.  Signal intensity within the thoracic spinal cord is normal.  Paraspinous soft tissues are within normal limits. Visualized lungs are grossly clear.  Tiny left paracentral disc protrusion at T5-6 without significant stenosis. Tiny central disc protrusion at T7-8 without stenosis. Minimal disc bulge at T8-9 without stenosis. Shallow right foraminal disc protrusion at T9-10 without stenosis or neural impingement. Mild disc bulge at T10-11 without significant stenosis.  MRI LUMBAR SPINE:  In correlation with the thoracic spine, there appears to be transitional lumbosacral anatomy with partial lumbarization of the S1 segment. Vertebral bodies  are normally aligned with preservation of the normal lumbar lordosis. Vertebral body heights are maintained. No acute fracture. No bone marrow edema. No focal osseous lesion.  Conus medullaris terminates normally at the L1-2 level. Signal intensity within the visualized cord is normal.  Paraspinous soft tissues demonstrate no acute abnormality. Visualized intra-abdominal aorta within normal limits. T2 hyperintense cyst partially visualized within the left kidney.  L1-2: Degenerative disc desiccation present. Superimposed small central disc extrusion indents the ventral thecal sac without significant canal stenosis. Foramina remain widely patent.  L2-3: Degenerative disc desiccation with intervertebral disc space narrowing and mild diffuse disc bulge. Anterior endplate osteophytic spurring. No significant canal stenosis. Foramina remain widely patent.  L3-4: Mild diffuse disc bulge with disc desiccation. No  focal disc herniation. No significant facet arthropathy. No canal or foraminal stenosis.  L4-5: Diffuse degenerative disc bulge with disc desiccation. No definite focal disc herniation. Superimposed facet and ligamentous hypertrophy. There is resultant mild canal and right foraminal narrowing. Left neural foramen remains widely patent.  L5-S1: Degenerative disc desiccation with disc bulge. Superimposed shallow broad-based disc protrusion, slightly eccentric to the left. Superimposed facet and ligamentous hypertrophy. Protruding disc appears to contact the transiting left S1 nerve root as it courses through the lateral recess, and could potentially result in left lower extremity radiculopathy. There is mild canal and bilateral foraminal narrowing at this level.  IMPRESSION: MRI BRAIN:  1. No acute intracranial infarct or other process identified. 2. Generalized cerebral atrophy with mild to moderate chronic small vessel ischemic changes.  MRI CERVICAL SPINE:  1. Reversal of the normal cervical lordosis with apex at  C3, resulting in severe canal stenosis at C3-4 and C4-5 with kinking of the spinal cord at these levels. No associated cord signal changes. 2. Additional multilevel degenerative disc disease with resultant foraminal narrowing as detailed above.  MRI THORACIC SPINE:  1. No acute abnormality within the thoracic spine. 2. Chronic compression deformity of the T9 vertebral body without significant retropulsion. 3. Mild multilevel degenerative disc disease as detailed above. No significant canal or foraminal stenosis.  MRI LUMBAR SPINE:  1. No acute abnormality within the lumbar spine. No significant canal stenosis. 2. Shallow posterior disc protrusion at L5-S1, closely approximating the transiting left S1 nerve root in the left lateral recess. This could potentially result in left lower extremity radicular symptoms. 3. Small central disc extrusion at L1-2 without significant stenosis. 4. Additional mild degenerative changes as above. 5. Transitional lumbosacral anatomy. Careful correlation with imaging and plain film radiography recommended prior to any potential future planned surgical intervention.   Electronically Signed   By: Rise Mu M.D.   On: 01/25/2015 23:40   Mr Cervical Spine Wo Contrast  01/25/2015   CLINICAL DATA:  Initial evaluation for progressive decline, back pain, difficulty walking  EXAM: MRI HEAD WITHOUT CONTRAST  MRI CERVICAL SPINE WITHOUT CONTRAST  MRI THORACIC SPINE WITHOUT CONTRAST  MRI LUMBAR SPINE WITHOUT CONTRAST  TECHNIQUE: Multiplanar, multiecho pulse sequences of the brain and spine and surrounding structures were obtained without intravenous contrast.  COMPARISON:  Prior CT from earlier the same day.  FINDINGS: MRI BRAIN:  Study is degraded by motion artifact.  Diffuse prominence of the CSF containing spaces is compatible with generalized atrophy. Patchy and confluent T2/FLAIR hyperintensity within the periventricular and deep white matter both cerebral hemispheres most  consistent with chronic small vessel ischemic changes.  No acute infarct. No mass lesion, midline shift, or mass effect. No hydrocephalus. No extra-axial fluid collection. Normal intravascular flow voids maintained. No acute or chronic intracranial hemorrhage.  Orbits within normal limits. Paranasal sinuses are clear. No mastoid effusion. Inner ear structures normal.  Bone marrow signal intensity within normal limits.  MRI CERVICAL SPINE:  Craniocervical junction is widely patent.  There is reversal of the normal cervical lordosis with apex at C3. There is 3 mm anterolisthesis of C2 on C3, with 3 mm retrolisthesis of C3 on C4. Trace anterolisthesis of C5 on C6 and C7 on T1.  Vertebral body heights are maintained. No fracture. Heterogeneous degenerative disc disease present about the C3-4 intervertebral disc space. Otherwise vertebral body bone marrow signal intensity is normal  Signal intensity within the cervical spinal cord is normal. Spinal cord is kinked at the level  of C3-4.  Paraspinous soft tissues within normal limits. Normal intravascular flow voids present within the vertebral arteries.  C2-C3: Mild diffuse degenerative disc osteophyte with bilateral uncovertebral spurring. There is resultant moderate right with mild left foraminal stenosis. Posterior disc osteophyte partially effaces the ventral thecal sac and results in mild canal stenosis.  C3-C4: Diffuse degenerative disc osteophyte with degenerative endplate changes, bilateral uncovertebral spurring, and facet arthrosis. There is resultant severe right with moderate to severe left foraminal stenosis. There is severe canal narrowing with flattening of the cervical spinal cord. Thecal sac measures 7.7 mm in AP diameter. No associated cord signal changes.  C4-C5: Diffuse degenerative disc osteophyte with bilateral uncovertebral spurring, greater on the right. Superimposed mild facet hypertrophy. There is severe right with moderate to severe left  foraminal stenosis. Posterior disc osteophyte largely effaces the ventral thecal sac and results in moderate to severe canal stenosis. Mild flattening of the cervical spinal cord without cord signal changes.  C5-C6: Bilateral uncovertebral spurring with prominent right-sided facet arthrosis. There is resultant severe right foraminal narrowing. Mild to moderate left foraminal stenosis. Posterior disc osteophyte minimally effaces the ventral thecal sac with resultant mild canal stenosis.  C6-C7: Bilateral uncovertebral hypertrophy with mild facet arthrosis. Resultant moderate bilateral foraminal stenosis. Posterior disc osteophyte results in mild canal narrowing.  C7-T1: Bilateral uncovertebral hypertrophy with facet arthrosis. There is left greater than right mild canal narrowing. Left paracentral disc osteophyte minimally indents the ventral thecal sac with resultant mild canal narrowing.  MRI THORACIC SPINE:  Vertebral bodies are normally aligned with preservation of the normal thoracic kyphosis. No listhesis.  Chronic compression deformity of the T9 vertebral body noted. Associated degenerative endplate Schmorl's node present at the superior endplate of T9. Otherwise, vertebral body heights are maintained. No acute fracture. No marrow edema. No focal osseous lesion.  Signal intensity within the thoracic spinal cord is normal.  Paraspinous soft tissues are within normal limits. Visualized lungs are grossly clear.  Tiny left paracentral disc protrusion at T5-6 without significant stenosis. Tiny central disc protrusion at T7-8 without stenosis. Minimal disc bulge at T8-9 without stenosis. Shallow right foraminal disc protrusion at T9-10 without stenosis or neural impingement. Mild disc bulge at T10-11 without significant stenosis.  MRI LUMBAR SPINE:  In correlation with the thoracic spine, there appears to be transitional lumbosacral anatomy with partial lumbarization of the S1 segment. Vertebral bodies are normally  aligned with preservation of the normal lumbar lordosis. Vertebral body heights are maintained. No acute fracture. No bone marrow edema. No focal osseous lesion.  Conus medullaris terminates normally at the L1-2 level. Signal intensity within the visualized cord is normal.  Paraspinous soft tissues demonstrate no acute abnormality. Visualized intra-abdominal aorta within normal limits. T2 hyperintense cyst partially visualized within the left kidney.  L1-2: Degenerative disc desiccation present. Superimposed small central disc extrusion indents the ventral thecal sac without significant canal stenosis. Foramina remain widely patent.  L2-3: Degenerative disc desiccation with intervertebral disc space narrowing and mild diffuse disc bulge. Anterior endplate osteophytic spurring. No significant canal stenosis. Foramina remain widely patent.  L3-4: Mild diffuse disc bulge with disc desiccation. No focal disc herniation. No significant facet arthropathy. No canal or foraminal stenosis.  L4-5: Diffuse degenerative disc bulge with disc desiccation. No definite focal disc herniation. Superimposed facet and ligamentous hypertrophy. There is resultant mild canal and right foraminal narrowing. Left neural foramen remains widely patent.  L5-S1: Degenerative disc desiccation with disc bulge. Superimposed shallow broad-based disc protrusion, slightly eccentric to the  left. Superimposed facet and ligamentous hypertrophy. Protruding disc appears to contact the transiting left S1 nerve root as it courses through the lateral recess, and could potentially result in left lower extremity radiculopathy. There is mild canal and bilateral foraminal narrowing at this level.  IMPRESSION: MRI BRAIN:  1. No acute intracranial infarct or other process identified. 2. Generalized cerebral atrophy with mild to moderate chronic small vessel ischemic changes.  MRI CERVICAL SPINE:  1. Reversal of the normal cervical lordosis with apex at C3,  resulting in severe canal stenosis at C3-4 and C4-5 with kinking of the spinal cord at these levels. No associated cord signal changes. 2. Additional multilevel degenerative disc disease with resultant foraminal narrowing as detailed above.  MRI THORACIC SPINE:  1. No acute abnormality within the thoracic spine. 2. Chronic compression deformity of the T9 vertebral body without significant retropulsion. 3. Mild multilevel degenerative disc disease as detailed above. No significant canal or foraminal stenosis.  MRI LUMBAR SPINE:  1. No acute abnormality within the lumbar spine. No significant canal stenosis. 2. Shallow posterior disc protrusion at L5-S1, closely approximating the transiting left S1 nerve root in the left lateral recess. This could potentially result in left lower extremity radicular symptoms. 3. Small central disc extrusion at L1-2 without significant stenosis. 4. Additional mild degenerative changes as above. 5. Transitional lumbosacral anatomy. Careful correlation with imaging and plain film radiography recommended prior to any potential future planned surgical intervention.   Electronically Signed   By: Rise Mu M.D.   On: 01/25/2015 23:40   Mr Thoracic Spine Wo Contrast  01/25/2015   CLINICAL DATA:  Initial evaluation for progressive decline, back pain, difficulty walking  EXAM: MRI HEAD WITHOUT CONTRAST  MRI CERVICAL SPINE WITHOUT CONTRAST  MRI THORACIC SPINE WITHOUT CONTRAST  MRI LUMBAR SPINE WITHOUT CONTRAST  TECHNIQUE: Multiplanar, multiecho pulse sequences of the brain and spine and surrounding structures were obtained without intravenous contrast.  COMPARISON:  Prior CT from earlier the same day.  FINDINGS: MRI BRAIN:  Study is degraded by motion artifact.  Diffuse prominence of the CSF containing spaces is compatible with generalized atrophy. Patchy and confluent T2/FLAIR hyperintensity within the periventricular and deep white matter both cerebral hemispheres most consistent  with chronic small vessel ischemic changes.  No acute infarct. No mass lesion, midline shift, or mass effect. No hydrocephalus. No extra-axial fluid collection. Normal intravascular flow voids maintained. No acute or chronic intracranial hemorrhage.  Orbits within normal limits. Paranasal sinuses are clear. No mastoid effusion. Inner ear structures normal.  Bone marrow signal intensity within normal limits.  MRI CERVICAL SPINE:  Craniocervical junction is widely patent.  There is reversal of the normal cervical lordosis with apex at C3. There is 3 mm anterolisthesis of C2 on C3, with 3 mm retrolisthesis of C3 on C4. Trace anterolisthesis of C5 on C6 and C7 on T1.  Vertebral body heights are maintained. No fracture. Heterogeneous degenerative disc disease present about the C3-4 intervertebral disc space. Otherwise vertebral body bone marrow signal intensity is normal  Signal intensity within the cervical spinal cord is normal. Spinal cord is kinked at the level of C3-4.  Paraspinous soft tissues within normal limits. Normal intravascular flow voids present within the vertebral arteries.  C2-C3: Mild diffuse degenerative disc osteophyte with bilateral uncovertebral spurring. There is resultant moderate right with mild left foraminal stenosis. Posterior disc osteophyte partially effaces the ventral thecal sac and results in mild canal stenosis.  C3-C4: Diffuse degenerative disc osteophyte with degenerative endplate  changes, bilateral uncovertebral spurring, and facet arthrosis. There is resultant severe right with moderate to severe left foraminal stenosis. There is severe canal narrowing with flattening of the cervical spinal cord. Thecal sac measures 7.7 mm in AP diameter. No associated cord signal changes.  C4-C5: Diffuse degenerative disc osteophyte with bilateral uncovertebral spurring, greater on the right. Superimposed mild facet hypertrophy. There is severe right with moderate to severe left foraminal stenosis.  Posterior disc osteophyte largely effaces the ventral thecal sac and results in moderate to severe canal stenosis. Mild flattening of the cervical spinal cord without cord signal changes.  C5-C6: Bilateral uncovertebral spurring with prominent right-sided facet arthrosis. There is resultant severe right foraminal narrowing. Mild to moderate left foraminal stenosis. Posterior disc osteophyte minimally effaces the ventral thecal sac with resultant mild canal stenosis.  C6-C7: Bilateral uncovertebral hypertrophy with mild facet arthrosis. Resultant moderate bilateral foraminal stenosis. Posterior disc osteophyte results in mild canal narrowing.  C7-T1: Bilateral uncovertebral hypertrophy with facet arthrosis. There is left greater than right mild canal narrowing. Left paracentral disc osteophyte minimally indents the ventral thecal sac with resultant mild canal narrowing.  MRI THORACIC SPINE:  Vertebral bodies are normally aligned with preservation of the normal thoracic kyphosis. No listhesis.  Chronic compression deformity of the T9 vertebral body noted. Associated degenerative endplate Schmorl's node present at the superior endplate of T9. Otherwise, vertebral body heights are maintained. No acute fracture. No marrow edema. No focal osseous lesion.  Signal intensity within the thoracic spinal cord is normal.  Paraspinous soft tissues are within normal limits. Visualized lungs are grossly clear.  Tiny left paracentral disc protrusion at T5-6 without significant stenosis. Tiny central disc protrusion at T7-8 without stenosis. Minimal disc bulge at T8-9 without stenosis. Shallow right foraminal disc protrusion at T9-10 without stenosis or neural impingement. Mild disc bulge at T10-11 without significant stenosis.  MRI LUMBAR SPINE:  In correlation with the thoracic spine, there appears to be transitional lumbosacral anatomy with partial lumbarization of the S1 segment. Vertebral bodies are normally aligned with  preservation of the normal lumbar lordosis. Vertebral body heights are maintained. No acute fracture. No bone marrow edema. No focal osseous lesion.  Conus medullaris terminates normally at the L1-2 level. Signal intensity within the visualized cord is normal.  Paraspinous soft tissues demonstrate no acute abnormality. Visualized intra-abdominal aorta within normal limits. T2 hyperintense cyst partially visualized within the left kidney.  L1-2: Degenerative disc desiccation present. Superimposed small central disc extrusion indents the ventral thecal sac without significant canal stenosis. Foramina remain widely patent.  L2-3: Degenerative disc desiccation with intervertebral disc space narrowing and mild diffuse disc bulge. Anterior endplate osteophytic spurring. No significant canal stenosis. Foramina remain widely patent.  L3-4: Mild diffuse disc bulge with disc desiccation. No focal disc herniation. No significant facet arthropathy. No canal or foraminal stenosis.  L4-5: Diffuse degenerative disc bulge with disc desiccation. No definite focal disc herniation. Superimposed facet and ligamentous hypertrophy. There is resultant mild canal and right foraminal narrowing. Left neural foramen remains widely patent.  L5-S1: Degenerative disc desiccation with disc bulge. Superimposed shallow broad-based disc protrusion, slightly eccentric to the left. Superimposed facet and ligamentous hypertrophy. Protruding disc appears to contact the transiting left S1 nerve root as it courses through the lateral recess, and could potentially result in left lower extremity radiculopathy. There is mild canal and bilateral foraminal narrowing at this level.  IMPRESSION: MRI BRAIN:  1. No acute intracranial infarct or other process identified. 2. Generalized cerebral atrophy with  mild to moderate chronic small vessel ischemic changes.  MRI CERVICAL SPINE:  1. Reversal of the normal cervical lordosis with apex at C3, resulting in severe  canal stenosis at C3-4 and C4-5 with kinking of the spinal cord at these levels. No associated cord signal changes. 2. Additional multilevel degenerative disc disease with resultant foraminal narrowing as detailed above.  MRI THORACIC SPINE:  1. No acute abnormality within the thoracic spine. 2. Chronic compression deformity of the T9 vertebral body without significant retropulsion. 3. Mild multilevel degenerative disc disease as detailed above. No significant canal or foraminal stenosis.  MRI LUMBAR SPINE:  1. No acute abnormality within the lumbar spine. No significant canal stenosis. 2. Shallow posterior disc protrusion at L5-S1, closely approximating the transiting left S1 nerve root in the left lateral recess. This could potentially result in left lower extremity radicular symptoms. 3. Small central disc extrusion at L1-2 without significant stenosis. 4. Additional mild degenerative changes as above. 5. Transitional lumbosacral anatomy. Careful correlation with imaging and plain film radiography recommended prior to any potential future planned surgical intervention.   Electronically Signed   By: Rise Mu M.D.   On: 01/25/2015 23:40   Mr Lumbar Spine Wo Contrast  01/25/2015   CLINICAL DATA:  Initial evaluation for progressive decline, back pain, difficulty walking  EXAM: MRI HEAD WITHOUT CONTRAST  MRI CERVICAL SPINE WITHOUT CONTRAST  MRI THORACIC SPINE WITHOUT CONTRAST  MRI LUMBAR SPINE WITHOUT CONTRAST  TECHNIQUE: Multiplanar, multiecho pulse sequences of the brain and spine and surrounding structures were obtained without intravenous contrast.  COMPARISON:  Prior CT from earlier the same day.  FINDINGS: MRI BRAIN:  Study is degraded by motion artifact.  Diffuse prominence of the CSF containing spaces is compatible with generalized atrophy. Patchy and confluent T2/FLAIR hyperintensity within the periventricular and deep white matter both cerebral hemispheres most consistent with chronic small  vessel ischemic changes.  No acute infarct. No mass lesion, midline shift, or mass effect. No hydrocephalus. No extra-axial fluid collection. Normal intravascular flow voids maintained. No acute or chronic intracranial hemorrhage.  Orbits within normal limits. Paranasal sinuses are clear. No mastoid effusion. Inner ear structures normal.  Bone marrow signal intensity within normal limits.  MRI CERVICAL SPINE:  Craniocervical junction is widely patent.  There is reversal of the normal cervical lordosis with apex at C3. There is 3 mm anterolisthesis of C2 on C3, with 3 mm retrolisthesis of C3 on C4. Trace anterolisthesis of C5 on C6 and C7 on T1.  Vertebral body heights are maintained. No fracture. Heterogeneous degenerative disc disease present about the C3-4 intervertebral disc space. Otherwise vertebral body bone marrow signal intensity is normal  Signal intensity within the cervical spinal cord is normal. Spinal cord is kinked at the level of C3-4.  Paraspinous soft tissues within normal limits. Normal intravascular flow voids present within the vertebral arteries.  C2-C3: Mild diffuse degenerative disc osteophyte with bilateral uncovertebral spurring. There is resultant moderate right with mild left foraminal stenosis. Posterior disc osteophyte partially effaces the ventral thecal sac and results in mild canal stenosis.  C3-C4: Diffuse degenerative disc osteophyte with degenerative endplate changes, bilateral uncovertebral spurring, and facet arthrosis. There is resultant severe right with moderate to severe left foraminal stenosis. There is severe canal narrowing with flattening of the cervical spinal cord. Thecal sac measures 7.7 mm in AP diameter. No associated cord signal changes.  C4-C5: Diffuse degenerative disc osteophyte with bilateral uncovertebral spurring, greater on the right. Superimposed mild facet hypertrophy. There  is severe right with moderate to severe left foraminal stenosis. Posterior disc  osteophyte largely effaces the ventral thecal sac and results in moderate to severe canal stenosis. Mild flattening of the cervical spinal cord without cord signal changes.  C5-C6: Bilateral uncovertebral spurring with prominent right-sided facet arthrosis. There is resultant severe right foraminal narrowing. Mild to moderate left foraminal stenosis. Posterior disc osteophyte minimally effaces the ventral thecal sac with resultant mild canal stenosis.  C6-C7: Bilateral uncovertebral hypertrophy with mild facet arthrosis. Resultant moderate bilateral foraminal stenosis. Posterior disc osteophyte results in mild canal narrowing.  C7-T1: Bilateral uncovertebral hypertrophy with facet arthrosis. There is left greater than right mild canal narrowing. Left paracentral disc osteophyte minimally indents the ventral thecal sac with resultant mild canal narrowing.  MRI THORACIC SPINE:  Vertebral bodies are normally aligned with preservation of the normal thoracic kyphosis. No listhesis.  Chronic compression deformity of the T9 vertebral body noted. Associated degenerative endplate Schmorl's node present at the superior endplate of T9. Otherwise, vertebral body heights are maintained. No acute fracture. No marrow edema. No focal osseous lesion.  Signal intensity within the thoracic spinal cord is normal.  Paraspinous soft tissues are within normal limits. Visualized lungs are grossly clear.  Tiny left paracentral disc protrusion at T5-6 without significant stenosis. Tiny central disc protrusion at T7-8 without stenosis. Minimal disc bulge at T8-9 without stenosis. Shallow right foraminal disc protrusion at T9-10 without stenosis or neural impingement. Mild disc bulge at T10-11 without significant stenosis.  MRI LUMBAR SPINE:  In correlation with the thoracic spine, there appears to be transitional lumbosacral anatomy with partial lumbarization of the S1 segment. Vertebral bodies are normally aligned with preservation of the  normal lumbar lordosis. Vertebral body heights are maintained. No acute fracture. No bone marrow edema. No focal osseous lesion.  Conus medullaris terminates normally at the L1-2 level. Signal intensity within the visualized cord is normal.  Paraspinous soft tissues demonstrate no acute abnormality. Visualized intra-abdominal aorta within normal limits. T2 hyperintense cyst partially visualized within the left kidney.  L1-2: Degenerative disc desiccation present. Superimposed small central disc extrusion indents the ventral thecal sac without significant canal stenosis. Foramina remain widely patent.  L2-3: Degenerative disc desiccation with intervertebral disc space narrowing and mild diffuse disc bulge. Anterior endplate osteophytic spurring. No significant canal stenosis. Foramina remain widely patent.  L3-4: Mild diffuse disc bulge with disc desiccation. No focal disc herniation. No significant facet arthropathy. No canal or foraminal stenosis.  L4-5: Diffuse degenerative disc bulge with disc desiccation. No definite focal disc herniation. Superimposed facet and ligamentous hypertrophy. There is resultant mild canal and right foraminal narrowing. Left neural foramen remains widely patent.  L5-S1: Degenerative disc desiccation with disc bulge. Superimposed shallow broad-based disc protrusion, slightly eccentric to the left. Superimposed facet and ligamentous hypertrophy. Protruding disc appears to contact the transiting left S1 nerve root as it courses through the lateral recess, and could potentially result in left lower extremity radiculopathy. There is mild canal and bilateral foraminal narrowing at this level.  IMPRESSION: MRI BRAIN:  1. No acute intracranial infarct or other process identified. 2. Generalized cerebral atrophy with mild to moderate chronic small vessel ischemic changes.  MRI CERVICAL SPINE:  1. Reversal of the normal cervical lordosis with apex at C3, resulting in severe canal stenosis at  C3-4 and C4-5 with kinking of the spinal cord at these levels. No associated cord signal changes. 2. Additional multilevel degenerative disc disease with resultant foraminal narrowing as detailed above.  MRI  THORACIC SPINE:  1. No acute abnormality within the thoracic spine. 2. Chronic compression deformity of the T9 vertebral body without significant retropulsion. 3. Mild multilevel degenerative disc disease as detailed above. No significant canal or foraminal stenosis.  MRI LUMBAR SPINE:  1. No acute abnormality within the lumbar spine. No significant canal stenosis. 2. Shallow posterior disc protrusion at L5-S1, closely approximating the transiting left S1 nerve root in the left lateral recess. This could potentially result in left lower extremity radicular symptoms. 3. Small central disc extrusion at L1-2 without significant stenosis. 4. Additional mild degenerative changes as above. 5. Transitional lumbosacral anatomy. Careful correlation with imaging and plain film radiography recommended prior to any potential future planned surgical intervention.   Electronically Signed   By: Rise Mu M.D.   On: 01/25/2015 23:40    CBC  Recent Labs Lab 01/25/15 1500 01/26/15 0533  WBC 10.4 8.9  HGB 15.4 14.5  HCT 44.6 42.4  PLT 225 177  MCV 87.3 87.4  MCH 30.1 29.9  MCHC 34.5 34.2  RDW 12.4 12.5  LYMPHSABS 1.5  --   MONOABS 0.9  --   EOSABS 0.0  --   BASOSABS 0.0  --     Chemistries   Recent Labs Lab 01/25/15 1500 01/26/15 0533  NA 140 138  K 3.7 3.5  CL 98* 99*  CO2 33* 30  GLUCOSE 125* 103*  BUN 20 17  CREATININE 1.18 1.12  CALCIUM 10.6* 9.6  AST 22  --   ALT 16*  --   ALKPHOS 67  --   BILITOT 0.9  --    ------------------------------------------------------------------------------------------------------------------ estimated creatinine clearance is 41.9 mL/min (by C-G formula based on Cr of  1.12). ------------------------------------------------------------------------------------------------------------------ No results for input(s): HGBA1C in the last 72 hours. ------------------------------------------------------------------------------------------------------------------ No results for input(s): CHOL, HDL, LDLCALC, TRIG, CHOLHDL, LDLDIRECT in the last 72 hours. ------------------------------------------------------------------------------------------------------------------  Recent Labs  01/26/15 0533  TSH 1.667   ------------------------------------------------------------------------------------------------------------------  Recent Labs  01/26/15 0533  VITAMINB12 536    Coagulation profile  Recent Labs Lab 01/26/15 0045  INR 1.07    No results for input(s): DDIMER in the last 72 hours.  Cardiac Enzymes No results for input(s): CKMB, TROPONINI, MYOGLOBIN in the last 168 hours.  Invalid input(s): CK ------------------------------------------------------------------------------------------------------------------ Invalid input(s): POCBNP  No results for input(s): GLUCAP in the last 72 hours.   Brian Salinas M.D. [facility] 01/26/2015, 1:18 PM  Pager: [phone]   Between 7am to 7pm - call Pager - [phone]  After 7pm go to [email or web address] - password [email or web address]  Call night coverage person covering after 7pm

## 2015-01-26 NOTE — Progress Notes (Signed)
Subjective: No acute changes  Exam: Filed Vitals:   01/26/15 0437  BP: 127/77  Pulse: 83  Temp: 97.3 F (36.3 C)  Resp: 18   Gen: In bed, NAD MS: Awake, alert,  KC:MKLKJ, face symmetric.  Motor: He has marked difference in bulk between his upper and lower extremities. He has increased tone in his legs.  Sensory:intact to LT and pin, no level.  DTR:3+ thorughout  Pertinent Labs: TSH nml b12 536  MRI C-spine severe canal stenosis.   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 I suspect that his presentation represents deconditioning/debilitation from weight loss superimposed on a more chronic cervical myelopathy.   His cervical spine disease explains his myelopathic findings, but I do notthink fully explains his failure to thrive.   Recommendations: 1) Neurosurgery consult, ok to be done as outpatient 2) FTT workup per IM  3) No further recommendations at this time. Please call with any further questions or concerns.   Ritta Slot, MD [facility] [phone]  If 7pm- 7am, please page neurology on call as listed in AMION.

## 2015-01-26 NOTE — Clinical Social Work Note (Signed)
Clinical Social Work Assessment  Patient Details  Name: Brian Salinas MRN: [id number] Date of Birth: April 19, 1941  Date of referral:  01/26/15               Reason for consult:  Facility Placement                Permission sought to share information with:  Facility Industrial/product designer granted to share information::  Yes, Verbal Permission Granted  Name::        Agency::  SNF for referral purposes  Relationship::     Contact Information:     Housing/Transportation Living arrangements for the past 2 months:  Single Family Home Source of Information:  Adult Children Patient Interpreter Needed:  None Criminal Activity/Legal Involvement Pertinent to Current Situation/Hospitalization:  No - Comment as needed Significant Relationships:  Adult Children Lives with:  Self Do you feel safe going back to the place where you live?  No Need for family participation in patient care:  Yes (Comment) (Pt in pain during assessment and asked CSW to speak with his children)  Care giving concerns:  Pt family concerned about pt safety at home after discharge   Social Worker assessment / plan:  CSW made aware by nursing staff that pt family is requesting to speak about SNF options. CSW visited pt room and spoke with pt son and daughter. Pt also present but did not participate in discussion due to back pain. Pt family informed CSW that pt has been to [facility] in the past but would like to dc to a different facility if possible. CSW explained SNF referral process and family agreeable to referral being sent to all [facility]. Pt son with questions about long term planning. CSW answered questions as best possible but did also indicate that SNF social worker will also be able to assist once pt needs are better known after therapy.  Employment status:  Retired Database administrator PT Recommendations:  Inpatient Rehab Consult Information / Referral to community  resources:  Skilled Nursing Facility  Patient/Family's Response to care:  Pt family thankful for CSW visit but expressed frustration with lack of answers at this time. Pt family understanding of limited availability of staff on weekends but upset that pt remains uncomfortable.  Patient/Family's Understanding of and Emotional Response to Diagnosis, Current Treatment, and Prognosis:  Pt and pt family awaiting discussion with MD/testing to better identify what is causing pt current issues. Based off current discussion, pt family will likely have understanding of once they are able to speak with more staff.  Emotional Assessment Appearance:  Appears stated age, Well-Groomed Attitude/Demeanor/Rapport:  Complaining (Pt complaining of constant back pain) Affect (typically observed):  Appropriate, Calm, Frustrated, Restless Orientation:  Oriented to Self, Oriented to Place, Oriented to  Time, Oriented to Situation Alcohol / Substance use:  Not Applicable Psych involvement (Current and /or in the community):  No (Comment)  Discharge Needs  Concerns to be addressed:  Discharge Planning Concerns Readmission within the last 30 days:  No Current discharge risk:  Dependent with Mobility Barriers to Discharge:  Continued Medical Work up   [employer], Theresia Majors Weekend CSW [phone]

## 2015-01-26 NOTE — ED Notes (Signed)
PA at bedside.

## 2015-01-26 NOTE — Progress Notes (Signed)
Initial Nutrition Assessment  DOCUMENTATION CODES: Non-severe (moderate) malnutrition in context of chronic illness  INTERVENTION: Resource Breeze po TID, each supplement provides 250 kcal and 9 grams of protein  Monitor oral intake and add supplements as warranted   Recommend MVI  NUTRITION DIAGNOSIS: Inadequate oral intake related to poor appetite, weakness and lack of adequate social support as evidenced by reportedly eating <75% of estimated needs for > 1 month  GOAL: Patient will meet greater than or equal to 90% of their needs  MONITOR: PO intake, Supplement acceptance, Diet advancement, Labs, I & O's, Work up  REASON FOR ASSESSMENT: Consult Assessment of nutrition requirement/status  ASSESSMENT: 74 y.o. male PMHx Nephrolithiasis, BPH, Gout. Presents with weakness and stabbing pain and tingling in both of his legs for 2 weeks. Pt has been  unable to walk. Poor PO intake and weight loss for the past month (reports 20 lb in 30 days).   Spoke with pt and brother. Brother reports progressive decline since his surgery for kidney stones about 1 month ago. He says patient has been getting weaker and these past few days is the worse he has seen him. Pt also reported decreased appetite. Per brother, pt lives at home and does not really have someone there all the time to help him eat when he is weak.   Brother does think he has lost weight, though not as much as initially reported. Pt states normal weight is 125 lbs and he has lost all 17 lbs this past month. There is no documentation to confirm this. NFPE does reveal mild muscle/fat wasting.    Pt exhibited some odd behavior throughout visit. Every few second he would crunch up into the fetal position then lay back down. He reported "I cant get comfortable". Brother stated he also has the sensation of having to go to the bathroom, but nothing happens when he sits on the toilet. Pt denies n/v/d. Feels constipated-but does not sound  like he actually is.   Pt had eaten all of CL breakfast. Asked about snacks. Told pt we would see how he is eating once diet advanced. Agreed to RB in the interim.   Height: Ht Readings from Last 1 Encounters:  01/26/15 5\' 4"  (1.626 m)    Weight: Wt Readings from Last 1 Encounters:  01/26/15 108 lb (48.988 kg)    Ideal Body Weight:  59.1 kg  Wt Readings from Last 10 Encounters:  01/26/15 108 lb (48.988 kg)  11/30/14 110 lb 4 oz (50.009 kg)  08/08/12 108 lb 6.4 oz (49.17 kg)  06/30/12 120 lb (54.432 kg)  06/14/12 145 lb (65.772 kg)   BMI:  Body mass index is 18.53 kg/(m^2).  Estimated Nutritional Needs: Kcal:  1600-1700 kcals (33-35 kcal/kg) Protein:  60-70 g (1.2-14 g/kg) Fluid:  1.6-1.7 liters  Skin:  WDL  Diet Order:  Diet clear liquid Room service appropriate?: Yes; Fluid consistency:: Thin  EDUCATION NEEDS: No education needs identified at this time   Intake/Output Summary (Last 24 hours) at 01/26/15 1050 Last data filed at 01/26/15 1031  Gross per 24 hour  Intake      0 ml  Output    395 ml  Net   -395 ml    Last BM:  Unknown  Christophe Louis RD, LDN Nutrition Pager: [phone] 01/26/2015 10:50 AM

## 2015-01-26 NOTE — Progress Notes (Signed)
01/26/15 0748  PT Visit Information  Evaluation  Last PT Received On 01/26/15  Assistance Needed +1  History of Present Illness Brian Salinas is a 74 y.o. male with a history of Nephrolithiasis, BPH, Gout who was seen by Neurology in [facility] this AM due to weakness and stabbing pain and tingling in both of his legs for 2 weeks. He was seen and referred to the ED for further evaluation since he had been unable to walk, and has had progressive decline and weight loss for the past month. 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   Precautions  Precautions Fall  Restrictions  Weight Bearing Restrictions No  Home Living  Family/patient expects to be discharged to: Private residence  Living Arrangements Children  Available Help at Discharge Family  Type of Home House  Home Access Stairs to enter  Entrance Stairs-Number of Steps 6  Entrance Stairs-Rails Right  Home Layout One level  Home Equipment Grab bars - tub/shower  Additional Comments pt lives with son, is alone when son at work. Spoke with pt's brother who reports that pt's health and function has been declining over past year but had kidney stone surgery a month ago and has significantly declined since that time  Prior Function  Level of Independence Independent  Comments was independent at home until a week or so ago when he started having difficulty ambulating, was very slow, and has lost 10 lbs  Communication  Communication Other (comment) (difficult to understand without dentures)  Pain Assessment  Pain Assessment No/denies pain  Cognition  Arousal/Alertness Lethargic;Suspect due to medications  Behavior During Therapy Flat affect  Overall Cognitive Status Impaired/Different from baseline  Area of Impairment  Orientation;Following commands;Memory;Problem solving  Orientation Level Disoriented to;Time  Memory Decreased short-term memory  Following Commands Follows one step commands consistently;Follows one step commands with increased time  Problem Solving Slow processing;Decreased initiation;Requires verbal cues;Requires tactile cues  General Comments pt received Ativan in ED so lethargic and difficult to assess cognition accurately. However, pt's brother reports that he has had dampened response and slowness at home off meds as well. Noted that pt often had tongue slightly out of mouth and minimal verbalization during eval, did not have dentures in though  Upper Extremity Assessment  Upper Extremity Assessment Generalized weakness;Defer to OT evaluation  Lower Extremity Assessment  Lower Extremity Assessment Generalized weakness;RLE deficits/detail;LLE deficits/detail  RLE Deficits / Details pt very rigid throughout extremities. 4/5 knee ext, 3+/5 knee flex. Increased tension throughout body while lying in bed as well as with movement  RLE Coordination decreased gross motor;decreased fine motor  LLE Deficits / Details same as RLE  LLE Coordination decreased fine motor;decreased gross motor  Cervical / Trunk Assessment  Cervical / Trunk Assessment Kyphotic  Bed Mobility  Overal bed mobility Needs Assistance  Bed Mobility Supine to Sit  Supine to sit Mod assist  General bed mobility comments pt needed min tactile cues to initiate supine to sit and had difficulty continuing task. Mod A for hips to EOB for feet to floor. Increased tension in adductors with knees pressed together once in sitting.  Transfers  Overall transfer level Needs assistance  Equipment used 2 person hand held assist  Transfers Sit to/from [employer]  to Stand +2 safety/equipment;Min assist  Stand pivot transfers +2 safety/equipment;Min assist  General transfer comment bilateral HHA for stand pivot  transfer, slight left lean with inital standing.   Ambulation/Gait  Ambulation/Gait assistance +2 safety/equipment;Min assist  Ambulation Distance (Feet) 25 Feet  Assistive device 2 person hand held assist  General Gait Details bilateral HHA, pt with very narrow BOS, fwd flexed posture, vc's needed for continuing stepping, pt c/o mild LE pain when asked. Ambulated 6' with min A of one with pt pushing IV pole.   Gait Pattern/deviations Trunk flexed;Narrow base of support;Shuffle  Gait velocity very slow  Gait velocity interpretation <1.8 ft/sec, indicative of risk for recurrent falls  Balance  Overall balance assessment Needs assistance  Sitting-balance support No upper extremity supported;Feet supported  Sitting balance-Leahy Scale Fair  Sitting balance - Comments no LOB when sitting EOB  Standing balance support Bilateral upper extremity supported;During functional activity  Standing balance-Leahy Scale Poor  Standing balance comment requires UE support to maintain standing, unsteady on feet with diminshed balance reactions  General Comments  General comments (skin integrity, edema, etc.) rigidity noted throughout movements  PT - End of Session  Equipment Utilized During Treatment Gait belt  Activity Tolerance Patient limited by lethargy  Patient left in chair;with chair alarm set;with call bell/phone within reach;with family/visitor present  Nurse Communication Mobility status  PT Assessment  PT Therapy Diagnosis  Difficulty walking;Abnormality of gait;Generalized weakness;Altered mental status  PT Recommendation/Assessment Patient needs continued PT services  PT Problem List Decreased strength;Decreased activity tolerance;Decreased balance;Decreased mobility;Decreased coordination;Decreased cognition;Impaired tone  Barriers to Discharge Decreased caregiver support  Barriers to Discharge Comments stays home alone at times  PT Plan  PT Frequency (ACUTE ONLY) Min 3X/week  PT  Treatment/Interventions (ACUTE ONLY) DME instruction;Gait training;Stair training;Functional mobility training;Therapeutic activities;Therapeutic exercise;Balance training;Patient/family education;Neuromuscular re-education;Cognitive remediation  PT Recommendation  Recommendations for Other Services OT consult;Speech consult  Follow Up Recommendations CIR  PT equipment Other (comment) (TBD)  Individuals Consulted  Consulted and Agree with Results and Recommendations Patient;Family member/caregiver  Family Member Consulted brother  Acute Rehab PT Goals  Patient Stated Goal none stated  PT Goal Formulation Patient unable to participate in goal setting  Time For Goal Achievement 02/09/15  Potential to Achieve Goals Fair  PT Time Calculation  PT Start Time (ACUTE ONLY) 0854  PT Stop Time (ACUTE ONLY) 0925  PT Time Calculation (min) (ACUTE ONLY) 31 min  Pt admitted with above diagnosis. Pt currently with functional limitations due to the deficits listed above (see PT Problem List). Pt had received Ativan prior to eval, so difficulty to accurately assess cognition but at this point, pt presents with needs for additional therapy. Ambulated 25' with min A +2 with slow processing time and diminshed balance reactions. Pt with very rigid musculature throughout all movements.  Pt will benefit from skilled PT to increase their independence and safety with mobility to allow discharge to the venue listed above. Request OT and SLP consults.   Lyanne Co, PT  Acute Rehab Services  [phone]

## 2015-01-27 DIAGNOSIS — R627 Adult failure to thrive: Secondary | ICD-10-CM | POA: Diagnosis not present

## 2015-01-27 DIAGNOSIS — R29898 Other symptoms and signs involving the musculoskeletal system: Secondary | ICD-10-CM | POA: Diagnosis not present

## 2015-01-27 DIAGNOSIS — N400 Enlarged prostate without lower urinary tract symptoms: Secondary | ICD-10-CM | POA: Diagnosis not present

## 2015-01-27 LAB — BASIC METABOLIC PANEL
ANION GAP: 8 (ref 5–15)
BUN: 9 mg/dL (ref 6–20)
CO2: 30 mmol/L (ref 22–32)
CREATININE: 0.9 mg/dL (ref 0.61–1.24)
Calcium: 9.9 mg/dL (ref 8.9–10.3)
Chloride: 101 mmol/L (ref 101–111)
GFR calc Af Amer: >60 mL/min (ref >60)
Glucose, Bld: 100 mg/dL — ABNORMAL HIGH (ref 65–99)
Potassium: 3.7 mmol/L (ref 3.5–5.1)
Sodium: 139 mmol/L (ref 135–145)

## 2015-01-27 LAB — CBC
HCT: 41.8 % (ref 39.0–52.0)
HEMOGLOBIN: 14.4 g/dL (ref 13.0–17.0)
MCH: 30 pg (ref 26.0–34.0)
MCHC: 34.4 g/dL (ref 30.0–36.0)
MCV: 87.1 fL (ref 78.0–100.0)
Platelets: 187 10*3/uL (ref 150–400)
RBC: 4.8 MIL/uL (ref 4.22–5.81)
RDW: 12.3 % (ref 11.5–15.5)
WBC: 8.4 10*3/uL (ref 4.0–10.5)

## 2015-01-27 MED ORDER — PANTOPRAZOLE SODIUM 40 MG PO TBEC
40.0000 mg | DELAYED_RELEASE_TABLET | Freq: Every day | ORAL | Status: DC
  Administered 2015-01-28 – 2015-01-29 (×2): 40 mg via ORAL
  Filled 2015-01-27 (×2): qty 1

## 2015-01-27 MED ORDER — POLYETHYLENE GLYCOL 3350 17 G PO PACK
17.0000 g | PACK | Freq: Every day | ORAL | Status: DC
  Administered 2015-01-27 – 2015-01-28 (×2): 17 g via ORAL
  Filled 2015-01-27 (×3): qty 1

## 2015-01-27 MED ORDER — DOCUSATE SODIUM 100 MG PO CAPS
100.0000 mg | ORAL_CAPSULE | Freq: Two times a day (BID) | ORAL | Status: DC
  Administered 2015-01-27 – 2015-01-28 (×2): 100 mg via ORAL
  Filled 2015-01-27 (×6): qty 1

## 2015-01-27 NOTE — Evaluation (Signed)
Clinical/Bedside Swallow Evaluation Patient Details  Name: Brian Salinas MRN: [id number] Date of Birth: 10/12/1940  Today's Date: 01/27/2015 Time: SLP Start Time (ACUTE ONLY): 1030 SLP Stop Time (ACUTE ONLY): 1040 SLP Time Calculation (min) (ACUTE ONLY): 10 min  Past Medical History:  Past Medical History  Diagnosis Date  . Bladder troubles   . Gout   . History of kidney stones   . BPH (benign prostatic hyperplasia)    Past Surgical History:  Past Surgical History  Procedure Laterality Date  . No past surgeries    . Colonoscopy    . Cystoscopy with retrograde pyelogram, ureteroscopy and stent placement Right 11/30/2014    Procedure: CYSTOSCOPY WITH RETROGRADE PYELOGRAM, URETEROSCOPY AND STENT PLACEMENT RIGHT SIDE;  Surgeon: Sebastian Ache, MD;  Location: [facility];  Service: Urology;  Laterality: Right;  . Holmium laser application Right 11/30/2014    Procedure: HOLMIUM LASER APPLICATION;  Surgeon: Sebastian Ache, MD;  Location: [facility];  Service: Urology;  Laterality: Right;   HPI:  Brian Salinas is a 74 y.o. male with a history of Nephrolithiasis, BPH, Gout who was seen by Neurology in [facility] this AM due to weakness and stabbing pain and tingling in both of his legs for 2 weeks. He was seen and referred to the ED for further evaluation since he had been unable to walk, and has had progressive decline and weight loss for the past month. He has had poor intake of foods and liquids and a reported loss of 20 pounds in 30 days. MRIs done of C-Spine through [facility] C-Spine Kink, Compression Fx of T-9, and DDD and Disk protrusions L5-S1. Pt to have neurosurgery consult.    Assessment / Plan / Recommendation Clinical Impression  Pt demonstrates normal swallow function, expresses no complaints regarding swallowing. He is able to masticate solids without dentures present. Recommend pt uprgrade to regular textures and thin liquids if agreeable to MD (currently on full  liquids). No SLP f/u needed.     Aspiration Risk       Diet Recommendation Age appropriate regular solids;Thin   Medication Administration: Whole meds with liquid    Other  Recommendations     Follow Up Recommendations       Frequency and Duration        Pertinent Vitals/Pain NA    SLP Swallow Goals     Swallow Study Prior Functional Status       General Other Pertinent Information: Brian Salinas is a 74 y.o. male with a history of Nephrolithiasis, BPH, Gout who was seen by Neurology in [facility] this AM due to weakness and stabbing pain and tingling in both of his legs for 2 weeks. He was seen and referred to the ED for further evaluation since he had been unable to walk, and has had progressive decline and weight loss for the past month. He has had poor intake of foods and liquids and a reported loss of 20 pounds in 30 days. MRIs done of C-Spine through [facility] C-Spine Kink, Compression Fx of T-9, and DDD and Disk protrusions L5-S1. Pt to have neurosurgery consult.  Type of Study: Bedside swallow evaluation Previous Swallow Assessment: none Diet Prior to this Study: Thin liquids Temperature Spikes Noted: No Respiratory Status: Room air History of Recent Intubation: No Behavior/Cognition: Alert;Cooperative;Pleasant mood Oral Cavity - Dentition: Edentulous (dentures at bedside pt states he doesnt need them) Self-Feeding Abilities: Able to feed self Patient Positioning: Upright in bed Baseline Vocal Quality: Normal  Volitional Cough: Strong Volitional Swallow: Able to elicit    Oral/Motor/Sensory Function Overall Oral Motor/Sensory Function: Appears within functional limits for tasks assessed   Ice Chips     Thin Liquid Thin Liquid: Within functional limits    Nectar Thick Nectar Thick Liquid: Not tested   Honey Thick Honey Thick Liquid: Not tested   Puree Puree: Within functional limits   Solid   GO    Solid: Within functional limits       [facility] MA CCC-SLP [phone]  Claudine Mouton 01/27/2015,10:43 AM

## 2015-01-27 NOTE — Consult Note (Signed)
Reason for Consult: Weakness, cervical spondylosis and stenosis, hypertonicity, hyperreflexia, gait difficulty Referring Physician: Dr. Vicenta Aly is an 74 y.o. male.  HPI: The patient is a 74 year old white male who presents with a complicated history. The patient is not a very good medical historian. Evidently he has had progressive difficulty with ambulation. He was seen at an outside neurology office and referred to the emergency department. The patient was worked up with a MRI of his brain, cervical, thoracic, and lumbar spine. Patient was noted to have cervical spondylosis and stenosis. Neurosurgical consultation has been requested.  Presently the patient is alert and pleasant. He denies neck pain. He admits to diffuse weakness and difficulty with ambulation.     Past Medical History  Diagnosis Date  . Bladder troubles   . Gout   . History of kidney stones   . BPH (benign prostatic hyperplasia)     Past Surgical History  Procedure Laterality Date  . No past surgeries    . Colonoscopy    . Cystoscopy with retrograde pyelogram, ureteroscopy and stent placement Right 11/30/2014    Procedure: CYSTOSCOPY WITH RETROGRADE PYELOGRAM, URETEROSCOPY AND STENT PLACEMENT RIGHT SIDE;  Surgeon: Alexis Frock, MD;  Location: [facility];  Service: Urology;  Laterality: Right;  . Holmium laser application Right 2/40/9735    Procedure: HOLMIUM LASER APPLICATION;  Surgeon: Alexis Frock, MD;  Location: [facility];  Service: Urology;  Laterality: Right;    Family History  Problem Relation Age of Onset  . Cancer Father   . Prostate cancer Brother     Social History:  reports that he quit smoking about 17 years ago. His smoking use included Cigarettes. He quit after 20 years of use. He has never used smokeless tobacco. He reports that he does not drink alcohol or use illicit drugs.  Allergies: No Known Allergies  Medications:  I have reviewed the patient's current  medications. Prior to Admission:  Prescriptions prior to admission  Medication Sig Dispense Refill Last Dose  . finasteride (PROSCAR) 5 MG tablet Take 5 mg by mouth daily.   01/25/2015 at Unknown time  . senna-docusate (SENOKOT-S) 8.6-50 MG per tablet Take 1 tablet by mouth 2 (two) times daily. While taking pain meds to prevent constipation 30 tablet 0 01/25/2015 at Unknown time  . Tamsulosin HCl (FLOMAX) 0.4 MG CAPS Take 0.8 mg by mouth daily.   01/25/2015 at Unknown time  . cephALEXin (KEFLEX) 500 MG capsule Take 1 capsule (500 mg total) by mouth 2 (two) times daily. X 4 days to prevent post-op infection (Patient not taking: Reported on 01/25/2015) 8 capsule 0 Not Taking at Unknown time  . oxyCODONE-acetaminophen (PERCOCET/ROXICET) 5-325 MG per tablet Take 1-2 tablets by mouth every 6 (six) hours as needed for moderate pain or severe pain. Post-operatively (Patient not taking: Reported on 01/25/2015) 30 tablet 0 Not Taking at Unknown time   Scheduled: . docusate sodium  100 mg Oral BID  . enoxaparin (LOVENOX) injection  30 mg Subcutaneous Q24H  . feeding supplement (RESOURCE BREEZE)  1 Container Oral TID BM  . finasteride  5 mg Oral Daily  . pantoprazole (PROTONIX) IV  40 mg Intravenous Q24H  . polyethylene glycol  17 g Oral Daily  . senna-docusate  1 tablet Oral BID  . sodium chloride  3 mL Intravenous Q12H  . tamsulosin  0.8 mg Oral Daily   Continuous: . sodium chloride Stopped (01/26/15 1624)   HGD:[id number] **OR** acetaminophen, alum & mag hydroxide-simeth, morphine injection,  ondansetron **OR** ondansetron (ZOFRAN) IV, oxyCODONE Anti-infectives    None       Results for orders placed or performed during the hospital encounter of 01/25/15 (from the past 48 hour(s))  CBC with Differential     Status: Abnormal   Collection Time: 01/25/15  3:00 PM  Result Value Ref Range   WBC 10.4 4.0 - 10.5 K/uL   RBC 5.11 4.22 - 5.81 MIL/uL   Hemoglobin 15.4 13.0 - 17.0 g/dL   HCT 44.6 39.0  - 52.0 %   MCV 87.3 78.0 - 100.0 fL   MCH 30.1 26.0 - 34.0 pg   MCHC 34.5 30.0 - 36.0 g/dL   RDW 12.4 11.5 - 15.5 %   Platelets 225 150 - 400 K/uL   Neutrophils Relative % 77 43 - 77 %   Neutro Abs 7.9 (H) 1.7 - 7.7 K/uL   Lymphocytes Relative 14 12 - 46 %   Lymphs Abs 1.5 0.7 - 4.0 K/uL   Monocytes Relative 9 3 - 12 %   Monocytes Absolute 0.9 0.1 - 1.0 K/uL   Eosinophils Relative 0 0 - 5 %   Eosinophils Absolute 0.0 0.0 - 0.7 K/uL   Basophils Relative 0 0 - 1 %   Basophils Absolute 0.0 0.0 - 0.1 K/uL  Comprehensive metabolic panel     Status: Abnormal   Collection Time: 01/25/15  3:00 PM  Result Value Ref Range   Sodium 140 135 - 145 mmol/L   Potassium 3.7 3.5 - 5.1 mmol/L   Chloride 98 (L) 101 - 111 mmol/L   CO2 33 (H) 22 - 32 mmol/L   Glucose, Bld 125 (H) 65 - 99 mg/dL   BUN 20 6 - 20 mg/dL   Creatinine, Ser 1.18 0.61 - 1.24 mg/dL   Calcium 10.6 (H) 8.9 - 10.3 mg/dL   Total Protein 7.7 6.5 - 8.1 g/dL   Albumin 3.8 3.5 - 5.0 g/dL   AST 22 15 - 41 U/L   ALT 16 (L) 17 - 63 U/L   Alkaline Phosphatase 67 38 - 126 U/L   Total Bilirubin 0.9 0.3 - 1.2 mg/dL   GFR calc non Af Amer >60 >60 mL/min   GFR calc Af Amer >60 >60 mL/min    Comment: (NOTE) The eGFR has been calculated using the CKD EPI equation. This calculation has not been validated in all clinical situations. eGFR's persistently <60 mL/min signify possible Chronic Kidney Disease.    Anion gap 9 5 - 15  Urinalysis, Routine w reflex microscopic (not at [facility])     Status: Abnormal   Collection Time: 01/25/15  3:00 PM  Result Value Ref Range   Color, Urine YELLOW YELLOW   APPearance CLEAR CLEAR   Specific Gravity, Urine >1.030 (H) 1.005 - 1.030   pH 5.5 5.0 - 8.0   Glucose, UA NEGATIVE NEGATIVE mg/dL   Hgb urine dipstick NEGATIVE NEGATIVE   Bilirubin Urine NEGATIVE NEGATIVE   Ketones, ur 15 (A) NEGATIVE mg/dL   Protein, ur NEGATIVE NEGATIVE mg/dL   Urobilinogen, UA 0.2 0.0 - 1.0 mg/dL   Nitrite NEGATIVE NEGATIVE    Leukocytes, UA NEGATIVE NEGATIVE    Comment: MICROSCOPIC NOT DONE ON URINES WITH NEGATIVE PROTEIN, BLOOD, LEUKOCYTES, NITRITE, OR GLUCOSE <1000 mg/dL.  Lipase, blood     Status: None   Collection Time: 01/25/15  6:38 PM  Result Value Ref Range   Lipase 23 22 - 51 U/L  Type and screen     Status: None  Collection Time: 01/26/15 12:10 AM  Result Value Ref Range   ABO/RH(D) O NEG    Antibody Screen NEG    Sample Expiration 01/29/2015   ABO/Rh     Status: None   Collection Time: 01/26/15 12:10 AM  Result Value Ref Range   ABO/RH(D) O NEG   Protime-INR     Status: None   Collection Time: 01/26/15 12:45 AM  Result Value Ref Range   Prothrombin Time 14.1 11.6 - 15.2 seconds   INR 1.07 0.00 - 1.49  TSH     Status: None   Collection Time: 01/26/15  5:33 AM  Result Value Ref Range   TSH 1.667 0.350 - 4.500 uIU/mL  HIV antibody     Status: None   Collection Time: 01/26/15  5:33 AM  Result Value Ref Range   HIV Screen 4th Generation wRfx Non Reactive Non Reactive    Comment: (NOTE) Performed At: [facility] [place], [place] [id number] Lindon Romp MD OM:[id number]   Vitamin B12     Status: None   Collection Time: 01/26/15  5:33 AM  Result Value Ref Range   Vitamin B-12 536 180 - 914 pg/mL    Comment: (NOTE) This assay is not validated for testing neonatal or myeloproliferative syndrome specimens for Vitamin B12 levels.   Prealbumin     Status: None   Collection Time: 01/26/15  5:33 AM  Result Value Ref Range   Prealbumin 26.5 18 - 38 mg/dL  Uric acid     Status: None   Collection Time: 01/26/15  5:33 AM  Result Value Ref Range   Uric Acid, Serum 6.7 4.4 - 7.6 mg/dL  Basic metabolic panel     Status: Abnormal   Collection Time: 01/26/15  5:33 AM  Result Value Ref Range   Sodium 138 135 - 145 mmol/L   Potassium 3.5 3.5 - 5.1 mmol/L   Chloride 99 (L) 101 - 111 mmol/L   CO2 30 22 - 32 mmol/L   Glucose, Bld 103 (H) 65 - 99 mg/dL   BUN 17 6 - 20  mg/dL   Creatinine, Ser 1.12 0.61 - 1.24 mg/dL   Calcium 9.6 8.9 - 10.3 mg/dL   GFR calc non Af Amer >60 >60 mL/min   GFR calc Af Amer >60 >60 mL/min    Comment: (NOTE) The eGFR has been calculated using the CKD EPI equation. This calculation has not been validated in all clinical situations. eGFR's persistently <60 mL/min signify possible Chronic Kidney Disease.    Anion gap 9 5 - 15  CBC     Status: None   Collection Time: 01/26/15  5:33 AM  Result Value Ref Range   WBC 8.9 4.0 - 10.5 K/uL   RBC 4.85 4.22 - 5.81 MIL/uL   Hemoglobin 14.5 13.0 - 17.0 g/dL   HCT 42.4 39.0 - 52.0 %   MCV 87.4 78.0 - 100.0 fL   MCH 29.9 26.0 - 34.0 pg   MCHC 34.2 30.0 - 36.0 g/dL   RDW 12.5 11.5 - 15.5 %   Platelets 177 150 - 400 K/uL  CBC     Status: None   Collection Time: 01/27/15  5:24 AM  Result Value Ref Range   WBC 8.4 4.0 - 10.5 K/uL   RBC 4.80 4.22 - 5.81 MIL/uL   Hemoglobin 14.4 13.0 - 17.0 g/dL   HCT 41.8 39.0 - 52.0 %   MCV 87.1 78.0 - 100.0 fL   MCH 30.0  26.0 - 34.0 pg   MCHC 34.4 30.0 - 36.0 g/dL   RDW 12.3 11.5 - 15.5 %   Platelets 187 150 - 400 K/uL  Basic metabolic panel     Status: Abnormal   Collection Time: 01/27/15  5:24 AM  Result Value Ref Range   Sodium 139 135 - 145 mmol/L   Potassium 3.7 3.5 - 5.1 mmol/L   Chloride 101 101 - 111 mmol/L   CO2 30 22 - 32 mmol/L   Glucose, Bld 100 (H) 65 - 99 mg/dL   BUN 9 6 - 20 mg/dL   Creatinine, Ser 0.90 0.61 - 1.24 mg/dL   Calcium 9.9 8.9 - 10.3 mg/dL   GFR calc non Af Amer >60 >60 mL/min   GFR calc Af Amer >60 >60 mL/min    Comment: (NOTE) The eGFR has been calculated using the CKD EPI equation. This calculation has not been validated in all clinical situations. eGFR's persistently <60 mL/min signify possible Chronic Kidney Disease.    Anion gap 8 5 - 15    Ct Head Wo Contrast  01/25/2015   CLINICAL DATA:  Bilateral lower extremity numbness for 2-3 weeks.  EXAM: CT HEAD WITHOUT CONTRAST  TECHNIQUE: Contiguous axial  images were obtained from the base of the skull through the vertex without intravenous contrast.  COMPARISON:  CT scan of August 05, 2012.  FINDINGS: Bony calvarium appears intact. Minimal diffuse cortical atrophy is noted. Mild chronic ischemic white matter disease is noted. No mass effect or midline shift is noted. Ventricular size is within normal limits. There is no evidence of mass lesion, hemorrhage or acute infarction.  IMPRESSION: Minimal diffuse cortical atrophy. Mild chronic ischemic white matter disease. No acute intracranial abnormality seen.   Electronically Signed   By: Marijo Conception, M.D.   On: 01/25/2015 19:53   Ct Chest W Contrast  01/26/2015   CLINICAL DATA:  Weight loss, failure to thrive  EXAM: CT CHEST, ABDOMEN, AND PELVIS WITH CONTRAST  TECHNIQUE: Multidetector CT imaging of the chest, abdomen and pelvis was performed following the standard protocol during bolus administration of intravenous contrast.  CONTRAST:  58m OMNIPAQUE IOHEXOL 300 MG/ML  SOLN  COMPARISON:  CT abdomen/ pelvis 11/24/2014. No prior similar imaging of the chest. Spine MRI 01/25/2015.  FINDINGS: CT CHEST FINDINGS  Mediastinum/Nodes: Moderate atheromatous aortic calcification without aneurysm. No lymphadenopathy. Great vessels are normal in caliber. Heart size is normal. No pericardial effusion. Representative 5 mm pretracheal node is identified image 24. No axillary lymphadenopathy.  Lungs/Pleura: Left maxillary sinus mucous retention cyst or polyp partly visualized. Diffuse emphysematous changes are noted within lungs. The lungs are clear. No pleural effusion.  Upper abdomen: Please see dedicated report below.  Musculoskeletal: No acute osseous abnormality.  CT ABDOMEN AND PELVIS FINDINGS  Lower chest:  Please see dedicated report above.  Hepatobiliary: 0.9 cm medial segment left hepatic lobe cyst. Liver is otherwise unremarkable. Gallbladder appears normal.  Pancreas: Normal  Spleen: Normal  Adrenals/Urinary Tract:  Adrenal glands and kidneys appear normal. No radiopaque renal or ureteral calculus is identified. Depending calculi are noted within the bladder.  Stomach/Bowel: Mild stool burden. No bowel wall thickening or focal segmental dilatation is identified. The appendix is normal.  Vascular/Lymphatic: Atheromatous aortic calcification without aneurysm. No lymphadenopathy.  Musculoskeletal: Extensive lumbar spine disc degenerative change but no acute osseous abnormality. T9 compression deformity is unchanged since MRI 2 days ago.  Other: No free air or fluid.  IMPRESSION: No acute intra-abdominal scratch but  no acute abnormality within the chest, abdomen, or pelvis or other finding to explain the history of weight loss and failure to thrive.   Electronically Signed   By: Conchita Paris M.D.   On: 01/26/2015 19:11   Mr Brain Wo Contrast  01/25/2015   CLINICAL DATA:  Initial evaluation for progressive decline, back pain, difficulty walking  EXAM: MRI HEAD WITHOUT CONTRAST  MRI CERVICAL SPINE WITHOUT CONTRAST  MRI THORACIC SPINE WITHOUT CONTRAST  MRI LUMBAR SPINE WITHOUT CONTRAST  TECHNIQUE: Multiplanar, multiecho pulse sequences of the brain and spine and surrounding structures were obtained without intravenous contrast.  COMPARISON:  Prior CT from earlier the same day.  FINDINGS: MRI BRAIN:  Study is degraded by motion artifact.  Diffuse prominence of the CSF containing spaces is compatible with generalized atrophy. Patchy and confluent T2/FLAIR hyperintensity within the periventricular and deep white matter both cerebral hemispheres most consistent with chronic small vessel ischemic changes.  No acute infarct. No mass lesion, midline shift, or mass effect. No hydrocephalus. No extra-axial fluid collection. Normal intravascular flow voids maintained. No acute or chronic intracranial hemorrhage.  Orbits within normal limits. Paranasal sinuses are clear. No mastoid effusion. Inner ear structures normal.  Bone marrow signal  intensity within normal limits.  MRI CERVICAL SPINE:  Craniocervical junction is widely patent.  There is reversal of the normal cervical lordosis with apex at C3. There is 3 mm anterolisthesis of C2 on C3, with 3 mm retrolisthesis of C3 on C4. Trace anterolisthesis of C5 on C6 and C7 on T1.  Vertebral body heights are maintained. No fracture. Heterogeneous degenerative disc disease present about the C3-4 intervertebral disc space. Otherwise vertebral body bone marrow signal intensity is normal  Signal intensity within the cervical spinal cord is normal. Spinal cord is kinked at the level of C3-4.  Paraspinous soft tissues within normal limits. Normal intravascular flow voids present within the vertebral arteries.  C2-C3: Mild diffuse degenerative disc osteophyte with bilateral uncovertebral spurring. There is resultant moderate right with mild left foraminal stenosis. Posterior disc osteophyte partially effaces the ventral thecal sac and results in mild canal stenosis.  C3-C4: Diffuse degenerative disc osteophyte with degenerative endplate changes, bilateral uncovertebral spurring, and facet arthrosis. There is resultant severe right with moderate to severe left foraminal stenosis. There is severe canal narrowing with flattening of the cervical spinal cord. Thecal sac measures 7.7 mm in AP diameter. No associated cord signal changes.  C4-C5: Diffuse degenerative disc osteophyte with bilateral uncovertebral spurring, greater on the right. Superimposed mild facet hypertrophy. There is severe right with moderate to severe left foraminal stenosis. Posterior disc osteophyte largely effaces the ventral thecal sac and results in moderate to severe canal stenosis. Mild flattening of the cervical spinal cord without cord signal changes.  C5-C6: Bilateral uncovertebral spurring with prominent right-sided facet arthrosis. There is resultant severe right foraminal narrowing. Mild to moderate left foraminal stenosis. Posterior  disc osteophyte minimally effaces the ventral thecal sac with resultant mild canal stenosis.  C6-C7: Bilateral uncovertebral hypertrophy with mild facet arthrosis. Resultant moderate bilateral foraminal stenosis. Posterior disc osteophyte results in mild canal narrowing.  C7-T1: Bilateral uncovertebral hypertrophy with facet arthrosis. There is left greater than right mild canal narrowing. Left paracentral disc osteophyte minimally indents the ventral thecal sac with resultant mild canal narrowing.  MRI THORACIC SPINE:  Vertebral bodies are normally aligned with preservation of the normal thoracic kyphosis. No listhesis.  Chronic compression deformity of the T9 vertebral body noted. Associated degenerative endplate Schmorl's node  present at the superior endplate of T9. Otherwise, vertebral body heights are maintained. No acute fracture. No marrow edema. No focal osseous lesion.  Signal intensity within the thoracic spinal cord is normal.  Paraspinous soft tissues are within normal limits. Visualized lungs are grossly clear.  Tiny left paracentral disc protrusion at T5-6 without significant stenosis. Tiny central disc protrusion at T7-8 without stenosis. Minimal disc bulge at T8-9 without stenosis. Shallow right foraminal disc protrusion at T9-10 without stenosis or neural impingement. Mild disc bulge at T10-11 without significant stenosis.  MRI LUMBAR SPINE:  In correlation with the thoracic spine, there appears to be transitional lumbosacral anatomy with partial lumbarization of the S1 segment. Vertebral bodies are normally aligned with preservation of the normal lumbar lordosis. Vertebral body heights are maintained. No acute fracture. No bone marrow edema. No focal osseous lesion.  Conus medullaris terminates normally at the L1-2 level. Signal intensity within the visualized cord is normal.  Paraspinous soft tissues demonstrate no acute abnormality. Visualized intra-abdominal aorta within normal limits. T2  hyperintense cyst partially visualized within the left kidney.  L1-2: Degenerative disc desiccation present. Superimposed small central disc extrusion indents the ventral thecal sac without significant canal stenosis. Foramina remain widely patent.  L2-3: Degenerative disc desiccation with intervertebral disc space narrowing and mild diffuse disc bulge. Anterior endplate osteophytic spurring. No significant canal stenosis. Foramina remain widely patent.  L3-4: Mild diffuse disc bulge with disc desiccation. No focal disc herniation. No significant facet arthropathy. No canal or foraminal stenosis.  L4-5: Diffuse degenerative disc bulge with disc desiccation. No definite focal disc herniation. Superimposed facet and ligamentous hypertrophy. There is resultant mild canal and right foraminal narrowing. Left neural foramen remains widely patent.  L5-S1: Degenerative disc desiccation with disc bulge. Superimposed shallow broad-based disc protrusion, slightly eccentric to the left. Superimposed facet and ligamentous hypertrophy. Protruding disc appears to contact the transiting left S1 nerve root as it courses through the lateral recess, and could potentially result in left lower extremity radiculopathy. There is mild canal and bilateral foraminal narrowing at this level.  IMPRESSION: MRI BRAIN:  1. No acute intracranial infarct or other process identified. 2. Generalized cerebral atrophy with mild to moderate chronic small vessel ischemic changes.  MRI CERVICAL SPINE:  1. Reversal of the normal cervical lordosis with apex at C3, resulting in severe canal stenosis at C3-4 and C4-5 with kinking of the spinal cord at these levels. No associated cord signal changes. 2. Additional multilevel degenerative disc disease with resultant foraminal narrowing as detailed above.  MRI THORACIC SPINE:  1. No acute abnormality within the thoracic spine. 2. Chronic compression deformity of the T9 vertebral body without significant  retropulsion. 3. Mild multilevel degenerative disc disease as detailed above. No significant canal or foraminal stenosis.  MRI LUMBAR SPINE:  1. No acute abnormality within the lumbar spine. No significant canal stenosis. 2. Shallow posterior disc protrusion at L5-S1, closely approximating the transiting left S1 nerve root in the left lateral recess. This could potentially result in left lower extremity radicular symptoms. 3. Small central disc extrusion at L1-2 without significant stenosis. 4. Additional mild degenerative changes as above. 5. Transitional lumbosacral anatomy. Careful correlation with imaging and plain film radiography recommended prior to any potential future planned surgical intervention.   Electronically Signed   By: Jeannine Boga M.D.   On: 01/25/2015 23:40   Mr Cervical Spine Wo Contrast  01/25/2015   CLINICAL DATA:  Initial evaluation for progressive decline, back pain, difficulty walking  EXAM:  MRI HEAD WITHOUT CONTRAST  MRI CERVICAL SPINE WITHOUT CONTRAST  MRI THORACIC SPINE WITHOUT CONTRAST  MRI LUMBAR SPINE WITHOUT CONTRAST  TECHNIQUE: Multiplanar, multiecho pulse sequences of the brain and spine and surrounding structures were obtained without intravenous contrast.  COMPARISON:  Prior CT from earlier the same day.  FINDINGS: MRI BRAIN:  Study is degraded by motion artifact.  Diffuse prominence of the CSF containing spaces is compatible with generalized atrophy. Patchy and confluent T2/FLAIR hyperintensity within the periventricular and deep white matter both cerebral hemispheres most consistent with chronic small vessel ischemic changes.  No acute infarct. No mass lesion, midline shift, or mass effect. No hydrocephalus. No extra-axial fluid collection. Normal intravascular flow voids maintained. No acute or chronic intracranial hemorrhage.  Orbits within normal limits. Paranasal sinuses are clear. No mastoid effusion. Inner ear structures normal.  Bone marrow signal intensity  within normal limits.  MRI CERVICAL SPINE:  Craniocervical junction is widely patent.  There is reversal of the normal cervical lordosis with apex at C3. There is 3 mm anterolisthesis of C2 on C3, with 3 mm retrolisthesis of C3 on C4. Trace anterolisthesis of C5 on C6 and C7 on T1.  Vertebral body heights are maintained. No fracture. Heterogeneous degenerative disc disease present about the C3-4 intervertebral disc space. Otherwise vertebral body bone marrow signal intensity is normal  Signal intensity within the cervical spinal cord is normal. Spinal cord is kinked at the level of C3-4.  Paraspinous soft tissues within normal limits. Normal intravascular flow voids present within the vertebral arteries.  C2-C3: Mild diffuse degenerative disc osteophyte with bilateral uncovertebral spurring. There is resultant moderate right with mild left foraminal stenosis. Posterior disc osteophyte partially effaces the ventral thecal sac and results in mild canal stenosis.  C3-C4: Diffuse degenerative disc osteophyte with degenerative endplate changes, bilateral uncovertebral spurring, and facet arthrosis. There is resultant severe right with moderate to severe left foraminal stenosis. There is severe canal narrowing with flattening of the cervical spinal cord. Thecal sac measures 7.7 mm in AP diameter. No associated cord signal changes.  C4-C5: Diffuse degenerative disc osteophyte with bilateral uncovertebral spurring, greater on the right. Superimposed mild facet hypertrophy. There is severe right with moderate to severe left foraminal stenosis. Posterior disc osteophyte largely effaces the ventral thecal sac and results in moderate to severe canal stenosis. Mild flattening of the cervical spinal cord without cord signal changes.  C5-C6: Bilateral uncovertebral spurring with prominent right-sided facet arthrosis. There is resultant severe right foraminal narrowing. Mild to moderate left foraminal stenosis. Posterior disc  osteophyte minimally effaces the ventral thecal sac with resultant mild canal stenosis.  C6-C7: Bilateral uncovertebral hypertrophy with mild facet arthrosis. Resultant moderate bilateral foraminal stenosis. Posterior disc osteophyte results in mild canal narrowing.  C7-T1: Bilateral uncovertebral hypertrophy with facet arthrosis. There is left greater than right mild canal narrowing. Left paracentral disc osteophyte minimally indents the ventral thecal sac with resultant mild canal narrowing.  MRI THORACIC SPINE:  Vertebral bodies are normally aligned with preservation of the normal thoracic kyphosis. No listhesis.  Chronic compression deformity of the T9 vertebral body noted. Associated degenerative endplate Schmorl's node present at the superior endplate of T9. Otherwise, vertebral body heights are maintained. No acute fracture. No marrow edema. No focal osseous lesion.  Signal intensity within the thoracic spinal cord is normal.  Paraspinous soft tissues are within normal limits. Visualized lungs are grossly clear.  Tiny left paracentral disc protrusion at T5-6 without significant stenosis. Tiny central disc protrusion at T7-8  without stenosis. Minimal disc bulge at T8-9 without stenosis. Shallow right foraminal disc protrusion at T9-10 without stenosis or neural impingement. Mild disc bulge at T10-11 without significant stenosis.  MRI LUMBAR SPINE:  In correlation with the thoracic spine, there appears to be transitional lumbosacral anatomy with partial lumbarization of the S1 segment. Vertebral bodies are normally aligned with preservation of the normal lumbar lordosis. Vertebral body heights are maintained. No acute fracture. No bone marrow edema. No focal osseous lesion.  Conus medullaris terminates normally at the L1-2 level. Signal intensity within the visualized cord is normal.  Paraspinous soft tissues demonstrate no acute abnormality. Visualized intra-abdominal aorta within normal limits. T2  hyperintense cyst partially visualized within the left kidney.  L1-2: Degenerative disc desiccation present. Superimposed small central disc extrusion indents the ventral thecal sac without significant canal stenosis. Foramina remain widely patent.  L2-3: Degenerative disc desiccation with intervertebral disc space narrowing and mild diffuse disc bulge. Anterior endplate osteophytic spurring. No significant canal stenosis. Foramina remain widely patent.  L3-4: Mild diffuse disc bulge with disc desiccation. No focal disc herniation. No significant facet arthropathy. No canal or foraminal stenosis.  L4-5: Diffuse degenerative disc bulge with disc desiccation. No definite focal disc herniation. Superimposed facet and ligamentous hypertrophy. There is resultant mild canal and right foraminal narrowing. Left neural foramen remains widely patent.  L5-S1: Degenerative disc desiccation with disc bulge. Superimposed shallow broad-based disc protrusion, slightly eccentric to the left. Superimposed facet and ligamentous hypertrophy. Protruding disc appears to contact the transiting left S1 nerve root as it courses through the lateral recess, and could potentially result in left lower extremity radiculopathy. There is mild canal and bilateral foraminal narrowing at this level.  IMPRESSION: MRI BRAIN:  1. No acute intracranial infarct or other process identified. 2. Generalized cerebral atrophy with mild to moderate chronic small vessel ischemic changes.  MRI CERVICAL SPINE:  1. Reversal of the normal cervical lordosis with apex at C3, resulting in severe canal stenosis at C3-4 and C4-5 with kinking of the spinal cord at these levels. No associated cord signal changes. 2. Additional multilevel degenerative disc disease with resultant foraminal narrowing as detailed above.  MRI THORACIC SPINE:  1. No acute abnormality within the thoracic spine. 2. Chronic compression deformity of the T9 vertebral body without significant  retropulsion. 3. Mild multilevel degenerative disc disease as detailed above. No significant canal or foraminal stenosis.  MRI LUMBAR SPINE:  1. No acute abnormality within the lumbar spine. No significant canal stenosis. 2. Shallow posterior disc protrusion at L5-S1, closely approximating the transiting left S1 nerve root in the left lateral recess. This could potentially result in left lower extremity radicular symptoms. 3. Small central disc extrusion at L1-2 without significant stenosis. 4. Additional mild degenerative changes as above. 5. Transitional lumbosacral anatomy. Careful correlation with imaging and plain film radiography recommended prior to any potential future planned surgical intervention.   Electronically Signed   By: Jeannine Boga M.D.   On: 01/25/2015 23:40   Mr Thoracic Spine Wo Contrast  01/25/2015   CLINICAL DATA:  Initial evaluation for progressive decline, back pain, difficulty walking  EXAM: MRI HEAD WITHOUT CONTRAST  MRI CERVICAL SPINE WITHOUT CONTRAST  MRI THORACIC SPINE WITHOUT CONTRAST  MRI LUMBAR SPINE WITHOUT CONTRAST  TECHNIQUE: Multiplanar, multiecho pulse sequences of the brain and spine and surrounding structures were obtained without intravenous contrast.  COMPARISON:  Prior CT from earlier the same day.  FINDINGS: MRI BRAIN:  Study is degraded by motion artifact.  Diffuse prominence of the CSF containing spaces is compatible with generalized atrophy. Patchy and confluent T2/FLAIR hyperintensity within the periventricular and deep white matter both cerebral hemispheres most consistent with chronic small vessel ischemic changes.  No acute infarct. No mass lesion, midline shift, or mass effect. No hydrocephalus. No extra-axial fluid collection. Normal intravascular flow voids maintained. No acute or chronic intracranial hemorrhage.  Orbits within normal limits. Paranasal sinuses are clear. No mastoid effusion. Inner ear structures normal.  Bone marrow signal intensity  within normal limits.  MRI CERVICAL SPINE:  Craniocervical junction is widely patent.  There is reversal of the normal cervical lordosis with apex at C3. There is 3 mm anterolisthesis of C2 on C3, with 3 mm retrolisthesis of C3 on C4. Trace anterolisthesis of C5 on C6 and C7 on T1.  Vertebral body heights are maintained. No fracture. Heterogeneous degenerative disc disease present about the C3-4 intervertebral disc space. Otherwise vertebral body bone marrow signal intensity is normal  Signal intensity within the cervical spinal cord is normal. Spinal cord is kinked at the level of C3-4.  Paraspinous soft tissues within normal limits. Normal intravascular flow voids present within the vertebral arteries.  C2-C3: Mild diffuse degenerative disc osteophyte with bilateral uncovertebral spurring. There is resultant moderate right with mild left foraminal stenosis. Posterior disc osteophyte partially effaces the ventral thecal sac and results in mild canal stenosis.  C3-C4: Diffuse degenerative disc osteophyte with degenerative endplate changes, bilateral uncovertebral spurring, and facet arthrosis. There is resultant severe right with moderate to severe left foraminal stenosis. There is severe canal narrowing with flattening of the cervical spinal cord. Thecal sac measures 7.7 mm in AP diameter. No associated cord signal changes.  C4-C5: Diffuse degenerative disc osteophyte with bilateral uncovertebral spurring, greater on the right. Superimposed mild facet hypertrophy. There is severe right with moderate to severe left foraminal stenosis. Posterior disc osteophyte largely effaces the ventral thecal sac and results in moderate to severe canal stenosis. Mild flattening of the cervical spinal cord without cord signal changes.  C5-C6: Bilateral uncovertebral spurring with prominent right-sided facet arthrosis. There is resultant severe right foraminal narrowing. Mild to moderate left foraminal stenosis. Posterior disc  osteophyte minimally effaces the ventral thecal sac with resultant mild canal stenosis.  C6-C7: Bilateral uncovertebral hypertrophy with mild facet arthrosis. Resultant moderate bilateral foraminal stenosis. Posterior disc osteophyte results in mild canal narrowing.  C7-T1: Bilateral uncovertebral hypertrophy with facet arthrosis. There is left greater than right mild canal narrowing. Left paracentral disc osteophyte minimally indents the ventral thecal sac with resultant mild canal narrowing.  MRI THORACIC SPINE:  Vertebral bodies are normally aligned with preservation of the normal thoracic kyphosis. No listhesis.  Chronic compression deformity of the T9 vertebral body noted. Associated degenerative endplate Schmorl's node present at the superior endplate of T9. Otherwise, vertebral body heights are maintained. No acute fracture. No marrow edema. No focal osseous lesion.  Signal intensity within the thoracic spinal cord is normal.  Paraspinous soft tissues are within normal limits. Visualized lungs are grossly clear.  Tiny left paracentral disc protrusion at T5-6 without significant stenosis. Tiny central disc protrusion at T7-8 without stenosis. Minimal disc bulge at T8-9 without stenosis. Shallow right foraminal disc protrusion at T9-10 without stenosis or neural impingement. Mild disc bulge at T10-11 without significant stenosis.  MRI LUMBAR SPINE:  In correlation with the thoracic spine, there appears to be transitional lumbosacral anatomy with partial lumbarization of the S1 segment. Vertebral bodies are normally aligned with preservation of the  normal lumbar lordosis. Vertebral body heights are maintained. No acute fracture. No bone marrow edema. No focal osseous lesion.  Conus medullaris terminates normally at the L1-2 level. Signal intensity within the visualized cord is normal.  Paraspinous soft tissues demonstrate no acute abnormality. Visualized intra-abdominal aorta within normal limits. T2  hyperintense cyst partially visualized within the left kidney.  L1-2: Degenerative disc desiccation present. Superimposed small central disc extrusion indents the ventral thecal sac without significant canal stenosis. Foramina remain widely patent.  L2-3: Degenerative disc desiccation with intervertebral disc space narrowing and mild diffuse disc bulge. Anterior endplate osteophytic spurring. No significant canal stenosis. Foramina remain widely patent.  L3-4: Mild diffuse disc bulge with disc desiccation. No focal disc herniation. No significant facet arthropathy. No canal or foraminal stenosis.  L4-5: Diffuse degenerative disc bulge with disc desiccation. No definite focal disc herniation. Superimposed facet and ligamentous hypertrophy. There is resultant mild canal and right foraminal narrowing. Left neural foramen remains widely patent.  L5-S1: Degenerative disc desiccation with disc bulge. Superimposed shallow broad-based disc protrusion, slightly eccentric to the left. Superimposed facet and ligamentous hypertrophy. Protruding disc appears to contact the transiting left S1 nerve root as it courses through the lateral recess, and could potentially result in left lower extremity radiculopathy. There is mild canal and bilateral foraminal narrowing at this level.  IMPRESSION: MRI BRAIN:  1. No acute intracranial infarct or other process identified. 2. Generalized cerebral atrophy with mild to moderate chronic small vessel ischemic changes.  MRI CERVICAL SPINE:  1. Reversal of the normal cervical lordosis with apex at C3, resulting in severe canal stenosis at C3-4 and C4-5 with kinking of the spinal cord at these levels. No associated cord signal changes. 2. Additional multilevel degenerative disc disease with resultant foraminal narrowing as detailed above.  MRI THORACIC SPINE:  1. No acute abnormality within the thoracic spine. 2. Chronic compression deformity of the T9 vertebral body without significant  retropulsion. 3. Mild multilevel degenerative disc disease as detailed above. No significant canal or foraminal stenosis.  MRI LUMBAR SPINE:  1. No acute abnormality within the lumbar spine. No significant canal stenosis. 2. Shallow posterior disc protrusion at L5-S1, closely approximating the transiting left S1 nerve root in the left lateral recess. This could potentially result in left lower extremity radicular symptoms. 3. Small central disc extrusion at L1-2 without significant stenosis. 4. Additional mild degenerative changes as above. 5. Transitional lumbosacral anatomy. Careful correlation with imaging and plain film radiography recommended prior to any potential future planned surgical intervention.   Electronically Signed   By: Jeannine Boga M.D.   On: 01/25/2015 23:40   Mr Lumbar Spine Wo Contrast  01/25/2015   CLINICAL DATA:  Initial evaluation for progressive decline, back pain, difficulty walking  EXAM: MRI HEAD WITHOUT CONTRAST  MRI CERVICAL SPINE WITHOUT CONTRAST  MRI THORACIC SPINE WITHOUT CONTRAST  MRI LUMBAR SPINE WITHOUT CONTRAST  TECHNIQUE: Multiplanar, multiecho pulse sequences of the brain and spine and surrounding structures were obtained without intravenous contrast.  COMPARISON:  Prior CT from earlier the same day.  FINDINGS: MRI BRAIN:  Study is degraded by motion artifact.  Diffuse prominence of the CSF containing spaces is compatible with generalized atrophy. Patchy and confluent T2/FLAIR hyperintensity within the periventricular and deep white matter both cerebral hemispheres most consistent with chronic small vessel ischemic changes.  No acute infarct. No mass lesion, midline shift, or mass effect. No hydrocephalus. No extra-axial fluid collection. Normal intravascular flow voids maintained. No acute or chronic intracranial  hemorrhage.  Orbits within normal limits. Paranasal sinuses are clear. No mastoid effusion. Inner ear structures normal.  Bone marrow signal intensity  within normal limits.  MRI CERVICAL SPINE:  Craniocervical junction is widely patent.  There is reversal of the normal cervical lordosis with apex at C3. There is 3 mm anterolisthesis of C2 on C3, with 3 mm retrolisthesis of C3 on C4. Trace anterolisthesis of C5 on C6 and C7 on T1.  Vertebral body heights are maintained. No fracture. Heterogeneous degenerative disc disease present about the C3-4 intervertebral disc space. Otherwise vertebral body bone marrow signal intensity is normal  Signal intensity within the cervical spinal cord is normal. Spinal cord is kinked at the level of C3-4.  Paraspinous soft tissues within normal limits. Normal intravascular flow voids present within the vertebral arteries.  C2-C3: Mild diffuse degenerative disc osteophyte with bilateral uncovertebral spurring. There is resultant moderate right with mild left foraminal stenosis. Posterior disc osteophyte partially effaces the ventral thecal sac and results in mild canal stenosis.  C3-C4: Diffuse degenerative disc osteophyte with degenerative endplate changes, bilateral uncovertebral spurring, and facet arthrosis. There is resultant severe right with moderate to severe left foraminal stenosis. There is severe canal narrowing with flattening of the cervical spinal cord. Thecal sac measures 7.7 mm in AP diameter. No associated cord signal changes.  C4-C5: Diffuse degenerative disc osteophyte with bilateral uncovertebral spurring, greater on the right. Superimposed mild facet hypertrophy. There is severe right with moderate to severe left foraminal stenosis. Posterior disc osteophyte largely effaces the ventral thecal sac and results in moderate to severe canal stenosis. Mild flattening of the cervical spinal cord without cord signal changes.  C5-C6: Bilateral uncovertebral spurring with prominent right-sided facet arthrosis. There is resultant severe right foraminal narrowing. Mild to moderate left foraminal stenosis. Posterior disc  osteophyte minimally effaces the ventral thecal sac with resultant mild canal stenosis.  C6-C7: Bilateral uncovertebral hypertrophy with mild facet arthrosis. Resultant moderate bilateral foraminal stenosis. Posterior disc osteophyte results in mild canal narrowing.  C7-T1: Bilateral uncovertebral hypertrophy with facet arthrosis. There is left greater than right mild canal narrowing. Left paracentral disc osteophyte minimally indents the ventral thecal sac with resultant mild canal narrowing.  MRI THORACIC SPINE:  Vertebral bodies are normally aligned with preservation of the normal thoracic kyphosis. No listhesis.  Chronic compression deformity of the T9 vertebral body noted. Associated degenerative endplate Schmorl's node present at the superior endplate of T9. Otherwise, vertebral body heights are maintained. No acute fracture. No marrow edema. No focal osseous lesion.  Signal intensity within the thoracic spinal cord is normal.  Paraspinous soft tissues are within normal limits. Visualized lungs are grossly clear.  Tiny left paracentral disc protrusion at T5-6 without significant stenosis. Tiny central disc protrusion at T7-8 without stenosis. Minimal disc bulge at T8-9 without stenosis. Shallow right foraminal disc protrusion at T9-10 without stenosis or neural impingement. Mild disc bulge at T10-11 without significant stenosis.  MRI LUMBAR SPINE:  In correlation with the thoracic spine, there appears to be transitional lumbosacral anatomy with partial lumbarization of the S1 segment. Vertebral bodies are normally aligned with preservation of the normal lumbar lordosis. Vertebral body heights are maintained. No acute fracture. No bone marrow edema. No focal osseous lesion.  Conus medullaris terminates normally at the L1-2 level. Signal intensity within the visualized cord is normal.  Paraspinous soft tissues demonstrate no acute abnormality. Visualized intra-abdominal aorta within normal limits. T2  hyperintense cyst partially visualized within the left kidney.  L1-2: Degenerative  disc desiccation present. Superimposed small central disc extrusion indents the ventral thecal sac without significant canal stenosis. Foramina remain widely patent.  L2-3: Degenerative disc desiccation with intervertebral disc space narrowing and mild diffuse disc bulge. Anterior endplate osteophytic spurring. No significant canal stenosis. Foramina remain widely patent.  L3-4: Mild diffuse disc bulge with disc desiccation. No focal disc herniation. No significant facet arthropathy. No canal or foraminal stenosis.  L4-5: Diffuse degenerative disc bulge with disc desiccation. No definite focal disc herniation. Superimposed facet and ligamentous hypertrophy. There is resultant mild canal and right foraminal narrowing. Left neural foramen remains widely patent.  L5-S1: Degenerative disc desiccation with disc bulge. Superimposed shallow broad-based disc protrusion, slightly eccentric to the left. Superimposed facet and ligamentous hypertrophy. Protruding disc appears to contact the transiting left S1 nerve root as it courses through the lateral recess, and could potentially result in left lower extremity radiculopathy. There is mild canal and bilateral foraminal narrowing at this level.  IMPRESSION: MRI BRAIN:  1. No acute intracranial infarct or other process identified. 2. Generalized cerebral atrophy with mild to moderate chronic small vessel ischemic changes.  MRI CERVICAL SPINE:  1. Reversal of the normal cervical lordosis with apex at C3, resulting in severe canal stenosis at C3-4 and C4-5 with kinking of the spinal cord at these levels. No associated cord signal changes. 2. Additional multilevel degenerative disc disease with resultant foraminal narrowing as detailed above.  MRI THORACIC SPINE:  1. No acute abnormality within the thoracic spine. 2. Chronic compression deformity of the T9 vertebral body without significant  retropulsion. 3. Mild multilevel degenerative disc disease as detailed above. No significant canal or foraminal stenosis.  MRI LUMBAR SPINE:  1. No acute abnormality within the lumbar spine. No significant canal stenosis. 2. Shallow posterior disc protrusion at L5-S1, closely approximating the transiting left S1 nerve root in the left lateral recess. This could potentially result in left lower extremity radicular symptoms. 3. Small central disc extrusion at L1-2 without significant stenosis. 4. Additional mild degenerative changes as above. 5. Transitional lumbosacral anatomy. Careful correlation with imaging and plain film radiography recommended prior to any potential future planned surgical intervention.   Electronically Signed   By: Jeannine Boga M.D.   On: 01/25/2015 23:40   Ct Abdomen Pelvis W Contrast  01/26/2015   CLINICAL DATA:  Weight loss, failure to thrive  EXAM: CT CHEST, ABDOMEN, AND PELVIS WITH CONTRAST  TECHNIQUE: Multidetector CT imaging of the chest, abdomen and pelvis was performed following the standard protocol during bolus administration of intravenous contrast.  CONTRAST:  73m OMNIPAQUE IOHEXOL 300 MG/ML  SOLN  COMPARISON:  CT abdomen/ pelvis 11/24/2014. No prior similar imaging of the chest. Spine MRI 01/25/2015.  FINDINGS: CT CHEST FINDINGS  Mediastinum/Nodes: Moderate atheromatous aortic calcification without aneurysm. No lymphadenopathy. Great vessels are normal in caliber. Heart size is normal. No pericardial effusion. Representative 5 mm pretracheal node is identified image 24. No axillary lymphadenopathy.  Lungs/Pleura: Left maxillary sinus mucous retention cyst or polyp partly visualized. Diffuse emphysematous changes are noted within lungs. The lungs are clear. No pleural effusion.  Upper abdomen: Please see dedicated report below.  Musculoskeletal: No acute osseous abnormality.  CT ABDOMEN AND PELVIS FINDINGS  Lower chest:  Please see dedicated report above.   Hepatobiliary: 0.9 cm medial segment left hepatic lobe cyst. Liver is otherwise unremarkable. Gallbladder appears normal.  Pancreas: Normal  Spleen: Normal  Adrenals/Urinary Tract: Adrenal glands and kidneys appear normal. No radiopaque renal or ureteral calculus is identified.  Depending calculi are noted within the bladder.  Stomach/Bowel: Mild stool burden. No bowel wall thickening or focal segmental dilatation is identified. The appendix is normal.  Vascular/Lymphatic: Atheromatous aortic calcification without aneurysm. No lymphadenopathy.  Musculoskeletal: Extensive lumbar spine disc degenerative change but no acute osseous abnormality. T9 compression deformity is unchanged since MRI 2 days ago.  Other: No free air or fluid.  IMPRESSION: No acute intra-abdominal scratch but no acute abnormality within the chest, abdomen, or pelvis or other finding to explain the history of weight loss and failure to thrive.   Electronically Signed   By: Conchita Paris M.D.   On: 01/26/2015 19:11    ROS: As above  Blood pressure 146/82, pulse 86, temperature 98 F (36.7 C), temperature source Oral, resp. rate 18, height 5' 4"  (1.626 m), weight 49.442 kg (109 lb), SpO2 98 %. Physical Exam General: An alert, thin/cachectic appearing 74 year old white male with a flat affect.  HEENT: Normocephalic, atraumatic, pupils equal round reactive light, extraocular muscles intact  Neck: Supple, nontender. He has a kyphotic cervical spine he has a limited cervical range of motion. Spurling's testing was negative bilaterally, Lhermitt sign was not present  Thorax: Symmetric  Abdomen: Soft  Extremities: Thin and atrophic  Back exam: Unremarkable  Neurologic exam: The patient is alert and oriented 3. He moves all 4 extremities well without obvious weakness. He is quite hypertonic and stiff. Sensation is normal to light touch in all tested dermatomes bilaterally. Cerebellar function is intact to rapid alternating movements  of the upper extremities bilaterally.  I have reviewed the patient's cervical MRI. He has a kyphotic cervical spine with disc degeneration, spondylosis, and stenosis most prominent at C3-4 and C4-5.  The patient's thoracic MRI demonstrates a mild T9 compression fracture without significant neural compression. His lumbar MRI demonstrates diffuse degenerative changes.  The patient's brain MRI is relatively unremarkable.     Assessment/Plan: Cervical spondylosis, stenosis, cervical myelopathy: The patient may have a bit of a cervical myelopathy however I think this is a small part of his clinical syndrome.  I do not think his cervical spondylosis/stenosis explains his stiffness and hypertonicity. I don't think there is any need for surgery in the foreseeable future, furthermore he does not appear to be an appropriate surgical candidate in his present condition. I'll be glad to see him in follow-up in the office. Please call if I can be of further assistance.  Yola Paradiso D 01/27/2015, 12:03 PM

## 2015-01-27 NOTE — Clinical Social Work Placement (Signed)
   CLINICAL SOCIAL WORK PLACEMENT  NOTE  Date:  01/27/2015  Patient Details  Name: Brian Salinas MRN: [id number] Date of Birth: May 21, 1941  Clinical Social Work is seeking post-discharge placement for this patient at the Skilled  Nursing Facility level of care (*CSW will initial, date and re-position this form in  chart as items are completed):  Yes   Patient/family provided with [facility] Department's list of facilities offering this level of care within the geographic area requested by the patient (or if unable, by the patient's family).  Yes   Patient/family informed of their freedom to choose among providers that offer the needed level of care, that participate in Medicare, Medicaid or managed care program needed by the patient, have an available bed and are willing to accept the patient.  Yes   Patient/family informed of [facility]'s ownership interest in [facility] and [facility], as well as of the fact that they are under no obligation to receive care at these facilities.  PASRR submitted to EDS on       PASRR number received on       Existing PASRR number confirmed on 01/27/15     FL2 transmitted to all facilities in geographic area requested by pt/family on 01/27/15     FL2 transmitted to all facilities within larger geographic area on       Patient informed that his/her managed care company has contracts with or will negotiate with certain facilities, including the following:            Patient/family informed of bed offers received.  Patient chooses bed at       Physician recommends and patient chooses bed at      Patient to be transferred to   on  .  Patient to be transferred to facility by       Patient family notified on   of transfer.  Name of family member notified:        PHYSICIAN       Additional Comment:    _______________________________________________ Roddie Mc MSW, [place], [place], [phone]

## 2015-01-27 NOTE — Progress Notes (Signed)
[facility]                                                                              Patient Demographics  Brian Salinas, is a 74 y.o. male, DOB - [birth year], WNU:[id number]  Admit date - 01/25/2015   Admitting Physician Ron Parker, MD  Outpatient Primary MD for the patient is Sid Falcon, MD  LOS - 1   Chief Complaint  Patient presents with  . Back Pain  . Leg Pain  . Constipation       Brief HPI   Brian Salinas is a 74 y.o. male with a history of Nephrolithiasis, BPH, Gout who was seen by Neurology in [facility] this AM due to weakness and stabbing pain and tingling in both of his legs for 2 weeks. He was seen and referred to the ED for further evaluation since he had been unable to walk, and has had progressive decline and weight loss for the past month. He has had poor intake of foods and liquids and a reported loss of 20 pounds in 30 days. He was seen by Neurology Dr. Hosie Poisson in the ED and was sent for MRIs of the C-spine, T-Spine and LS Spine and referred for further evaluation.    Assessment & Plan    Principal Problem:   Stiffness, cervical spondylosis, stenosis, cervical myelopathy Weakness of both legs - Unclear etiology likely due to degenerative disc disease, malnutrition - Neurology consulted, MRI of the C-spine, through lumbar spine done, showed C-Spine Kink, Compression Fx of T-9, and DDD and Disk protrusions L5-S1 - TSH 1.67, B12 normal - Appreciate neurology and neurosurgery conditions, per neurosurgery, Dr. Lovell Sheehan, patient may have bit of a cervical myelopathy however small part of his clinical syndrome, no need of any neurosurgery at this time - will continue PTOT evaluation, will need rehabilitation   Active Problems:    Adult failure to thrive, Severe protein-calorie malnutrition, left-sided abdominal pain - Patient did not complain any abdominal pain today - Swallow evaluation done, placed on regular  diet - CT abdomen and pelvis, showed no acute abdominal pathology -Due to smoking history, CT chest was obtained, showed no malignancy - Nutrition consult placed     Hypotension, Dehydration and UA positive for ketones - Continue gentle hydration, IV fluids     Hypercalcemia - Currently improving, continue IV fluid hydration     BPH (benign prostatic hyperplasia) -Continue Proscar and Flomax   Code Status: Full code  Family Communication: Discussed in detail with the patient, all imaging results, lab results explained to the patient. I also discussed in detail with the patient's both sons, Mr. Loraine Leriche and Alfonso Patten    Disposition Plan : May need skilled nursing facility   Time Spent in minutes  25 minutes  Procedures  Mri Cervical spine, thoracic spine, lumbar spine   CT chest and abdomen  Consults   Neuro  neurosurgery  DVT Prophylaxis   Lovenox  Medications  Scheduled Meds: . docusate sodium  100 mg Oral BID  . enoxaparin (LOVENOX) injection  30 mg Subcutaneous Q24H  . feeding supplement (RESOURCE BREEZE)  1  Container Oral TID BM  . finasteride  5 mg Oral Daily  . pantoprazole (PROTONIX) IV  40 mg Intravenous Q24H  . polyethylene glycol  17 g Oral Daily  . senna-docusate  1 tablet Oral BID  . sodium chloride  3 mL Intravenous Q12H  . tamsulosin  0.8 mg Oral Daily   Continuous Infusions: . sodium chloride Stopped (01/26/15 1624)   PRN Meds:.acetaminophen **OR** acetaminophen, alum & mag hydroxide-simeth, morphine injection, ondansetron **OR** ondansetron (ZOFRAN) IV, oxyCODONE   Antibiotics   Anti-infectives    None        Subjective:   Brian Salinas was seen and examined today.Feels a lot better today, much more alert and awake, pleasant, feels strength is slightly better. Denies any chest pain, shortness of breath, fevers or chills, nausea or vomiting. Did not have any abdominal pain today on my exam. No acute events overnight   Objective:    Blood pressure 146/82, pulse 86, temperature 98 F (36.7 C), temperature source Oral, resp. rate 18, height 5\' 4"  (1.626 m), weight 49.442 kg (109 lb), SpO2 98 %.  Wt Readings from Last 3 Encounters:  01/27/15 49.442 kg (109 lb)  11/30/14 50.009 kg (110 lb 4 oz)  08/08/12 49.17 kg (108 lb 6.4 oz)     Intake/Output Summary (Last 24 hours) at 01/27/15 1222 Last data filed at 01/27/15 1100  Gross per 24 hour  Intake   1525 ml  Output   3150 ml  Net  -1625 ml    Exam  General: Alert and oriented x 3 NAD  HEENT:  PERRLA, EOMI, Anicteric Sclera, mucous membranes moist.   Neck: Supple, no JVD, no masses  CVS: S1 S2 clear RRR  Respiratory: CTAB  Abdomen: Soft, nontender, nondistended, + bowel sounds  Ext: no cyanosis clubbing or edema  Neuro: AAOx3, Cr N's II- XII. Strength 4/5 upper and lower extremities bilaterally, stiffness  Skin: No rashes  Psych: Normal affect and demeanor, alert and oriented x3   Data Review   Micro Results No results found for this or any previous visit (from the past 240 hour(s)).  Radiology Reports Ct Head Wo Contrast  01/25/2015   CLINICAL DATA:  Bilateral lower extremity numbness for 2-3 weeks.  EXAM: CT HEAD WITHOUT CONTRAST  TECHNIQUE: Contiguous axial images were obtained from the base of the skull through the vertex without intravenous contrast.  COMPARISON:  CT scan of August 05, 2012.  FINDINGS: Bony calvarium appears intact. Minimal diffuse cortical atrophy is noted. Mild chronic ischemic white matter disease is noted. No mass effect or midline shift is noted. Ventricular size is within normal limits. There is no evidence of mass lesion, hemorrhage or acute infarction.  IMPRESSION: Minimal diffuse cortical atrophy. Mild chronic ischemic white matter disease. No acute intracranial abnormality seen.   Electronically Signed   By: Lupita Raider, M.D.   On: 01/25/2015 19:53   Ct Chest W Contrast  01/26/2015   CLINICAL DATA:  Weight  loss, failure to thrive  EXAM: CT CHEST, ABDOMEN, AND PELVIS WITH CONTRAST  TECHNIQUE: Multidetector CT imaging of the chest, abdomen and pelvis was performed following the standard protocol during bolus administration of intravenous contrast.  CONTRAST:  75mL OMNIPAQUE IOHEXOL 300 MG/ML  SOLN  COMPARISON:  CT abdomen/ pelvis 11/24/2014. No prior similar imaging of the chest. Spine MRI 01/25/2015.  FINDINGS: CT CHEST FINDINGS  Mediastinum/Nodes: Moderate atheromatous aortic calcification without aneurysm. No lymphadenopathy. Great vessels are normal in caliber. Heart size is normal. No  pericardial effusion. Representative 5 mm pretracheal node is identified image 24. No axillary lymphadenopathy.  Lungs/Pleura: Left maxillary sinus mucous retention cyst or polyp partly visualized. Diffuse emphysematous changes are noted within lungs. The lungs are clear. No pleural effusion.  Upper abdomen: Please see dedicated report below.  Musculoskeletal: No acute osseous abnormality.  CT ABDOMEN AND PELVIS FINDINGS  Lower chest:  Please see dedicated report above.  Hepatobiliary: 0.9 cm medial segment left hepatic lobe cyst. Liver is otherwise unremarkable. Gallbladder appears normal.  Pancreas: Normal  Spleen: Normal  Adrenals/Urinary Tract: Adrenal glands and kidneys appear normal. No radiopaque renal or ureteral calculus is identified. Depending calculi are noted within the bladder.  Stomach/Bowel: Mild stool burden. No bowel wall thickening or focal segmental dilatation is identified. The appendix is normal.  Vascular/Lymphatic: Atheromatous aortic calcification without aneurysm. No lymphadenopathy.  Musculoskeletal: Extensive lumbar spine disc degenerative change but no acute osseous abnormality. T9 compression deformity is unchanged since MRI 2 days ago.  Other: No free air or fluid.  IMPRESSION: No acute intra-abdominal scratch but no acute abnormality within the chest, abdomen, or pelvis or other finding to explain the  history of weight loss and failure to thrive.   Electronically Signed   By: Christiana Pellant M.D.   On: 01/26/2015 19:11   Mr Brain Wo Contrast  01/25/2015   CLINICAL DATA:  Initial evaluation for progressive decline, back pain, difficulty walking  EXAM: MRI HEAD WITHOUT CONTRAST  MRI CERVICAL SPINE WITHOUT CONTRAST  MRI THORACIC SPINE WITHOUT CONTRAST  MRI LUMBAR SPINE WITHOUT CONTRAST  TECHNIQUE: Multiplanar, multiecho pulse sequences of the brain and spine and surrounding structures were obtained without intravenous contrast.  COMPARISON:  Prior CT from earlier the same day.  FINDINGS: MRI BRAIN:  Study is degraded by motion artifact.  Diffuse prominence of the CSF containing spaces is compatible with generalized atrophy. Patchy and confluent T2/FLAIR hyperintensity within the periventricular and deep white matter both cerebral hemispheres most consistent with chronic small vessel ischemic changes.  No acute infarct. No mass lesion, midline shift, or mass effect. No hydrocephalus. No extra-axial fluid collection. Normal intravascular flow voids maintained. No acute or chronic intracranial hemorrhage.  Orbits within normal limits. Paranasal sinuses are clear. No mastoid effusion. Inner ear structures normal.  Bone marrow signal intensity within normal limits.  MRI CERVICAL SPINE:  Craniocervical junction is widely patent.  There is reversal of the normal cervical lordosis with apex at C3. There is 3 mm anterolisthesis of C2 on C3, with 3 mm retrolisthesis of C3 on C4. Trace anterolisthesis of C5 on C6 and C7 on T1.  Vertebral body heights are maintained. No fracture. Heterogeneous degenerative disc disease present about the C3-4 intervertebral disc space. Otherwise vertebral body bone marrow signal intensity is normal  Signal intensity within the cervical spinal cord is normal. Spinal cord is kinked at the level of C3-4.  Paraspinous soft tissues within normal limits. Normal intravascular flow voids present  within the vertebral arteries.  C2-C3: Mild diffuse degenerative disc osteophyte with bilateral uncovertebral spurring. There is resultant moderate right with mild left foraminal stenosis. Posterior disc osteophyte partially effaces the ventral thecal sac and results in mild canal stenosis.  C3-C4: Diffuse degenerative disc osteophyte with degenerative endplate changes, bilateral uncovertebral spurring, and facet arthrosis. There is resultant severe right with moderate to severe left foraminal stenosis. There is severe canal narrowing with flattening of the cervical spinal cord. Thecal sac measures 7.7 mm in AP diameter. No associated cord signal changes.  C4-C5: Diffuse degenerative disc osteophyte with bilateral uncovertebral spurring, greater on the right. Superimposed mild facet hypertrophy. There is severe right with moderate to severe left foraminal stenosis. Posterior disc osteophyte largely effaces the ventral thecal sac and results in moderate to severe canal stenosis. Mild flattening of the cervical spinal cord without cord signal changes.  C5-C6: Bilateral uncovertebral spurring with prominent right-sided facet arthrosis. There is resultant severe right foraminal narrowing. Mild to moderate left foraminal stenosis. Posterior disc osteophyte minimally effaces the ventral thecal sac with resultant mild canal stenosis.  C6-C7: Bilateral uncovertebral hypertrophy with mild facet arthrosis. Resultant moderate bilateral foraminal stenosis. Posterior disc osteophyte results in mild canal narrowing.  C7-T1: Bilateral uncovertebral hypertrophy with facet arthrosis. There is left greater than right mild canal narrowing. Left paracentral disc osteophyte minimally indents the ventral thecal sac with resultant mild canal narrowing.  MRI THORACIC SPINE:  Vertebral bodies are normally aligned with preservation of the normal thoracic kyphosis. No listhesis.  Chronic compression deformity of the T9 vertebral body noted.  Associated degenerative endplate Schmorl's node present at the superior endplate of T9. Otherwise, vertebral body heights are maintained. No acute fracture. No marrow edema. No focal osseous lesion.  Signal intensity within the thoracic spinal cord is normal.  Paraspinous soft tissues are within normal limits. Visualized lungs are grossly clear.  Tiny left paracentral disc protrusion at T5-6 without significant stenosis. Tiny central disc protrusion at T7-8 without stenosis. Minimal disc bulge at T8-9 without stenosis. Shallow right foraminal disc protrusion at T9-10 without stenosis or neural impingement. Mild disc bulge at T10-11 without significant stenosis.  MRI LUMBAR SPINE:  In correlation with the thoracic spine, there appears to be transitional lumbosacral anatomy with partial lumbarization of the S1 segment. Vertebral bodies are normally aligned with preservation of the normal lumbar lordosis. Vertebral body heights are maintained. No acute fracture. No bone marrow edema. No focal osseous lesion.  Conus medullaris terminates normally at the L1-2 level. Signal intensity within the visualized cord is normal.  Paraspinous soft tissues demonstrate no acute abnormality. Visualized intra-abdominal aorta within normal limits. T2 hyperintense cyst partially visualized within the left kidney.  L1-2: Degenerative disc desiccation present. Superimposed small central disc extrusion indents the ventral thecal sac without significant canal stenosis. Foramina remain widely patent.  L2-3: Degenerative disc desiccation with intervertebral disc space narrowing and mild diffuse disc bulge. Anterior endplate osteophytic spurring. No significant canal stenosis. Foramina remain widely patent.  L3-4: Mild diffuse disc bulge with disc desiccation. No focal disc herniation. No significant facet arthropathy. No canal or foraminal stenosis.  L4-5: Diffuse degenerative disc bulge with disc desiccation. No definite focal disc  herniation. Superimposed facet and ligamentous hypertrophy. There is resultant mild canal and right foraminal narrowing. Left neural foramen remains widely patent.  L5-S1: Degenerative disc desiccation with disc bulge. Superimposed shallow broad-based disc protrusion, slightly eccentric to the left. Superimposed facet and ligamentous hypertrophy. Protruding disc appears to contact the transiting left S1 nerve root as it courses through the lateral recess, and could potentially result in left lower extremity radiculopathy. There is mild canal and bilateral foraminal narrowing at this level.  IMPRESSION: MRI BRAIN:  1. No acute intracranial infarct or other process identified. 2. Generalized cerebral atrophy with mild to moderate chronic small vessel ischemic changes.  MRI CERVICAL SPINE:  1. Reversal of the normal cervical lordosis with apex at C3, resulting in severe canal stenosis at C3-4 and C4-5 with kinking of the spinal cord at these levels. No associated cord  signal changes. 2. Additional multilevel degenerative disc disease with resultant foraminal narrowing as detailed above.  MRI THORACIC SPINE:  1. No acute abnormality within the thoracic spine. 2. Chronic compression deformity of the T9 vertebral body without significant retropulsion. 3. Mild multilevel degenerative disc disease as detailed above. No significant canal or foraminal stenosis.  MRI LUMBAR SPINE:  1. No acute abnormality within the lumbar spine. No significant canal stenosis. 2. Shallow posterior disc protrusion at L5-S1, closely approximating the transiting left S1 nerve root in the left lateral recess. This could potentially result in left lower extremity radicular symptoms. 3. Small central disc extrusion at L1-2 without significant stenosis. 4. Additional mild degenerative changes as above. 5. Transitional lumbosacral anatomy. Careful correlation with imaging and plain film radiography recommended prior to any potential future planned  surgical intervention.   Electronically Signed   By: Rise Mu M.D.   On: 01/25/2015 23:40   Mr Cervical Spine Wo Contrast  01/25/2015   CLINICAL DATA:  Initial evaluation for progressive decline, back pain, difficulty walking  EXAM: MRI HEAD WITHOUT CONTRAST  MRI CERVICAL SPINE WITHOUT CONTRAST  MRI THORACIC SPINE WITHOUT CONTRAST  MRI LUMBAR SPINE WITHOUT CONTRAST  TECHNIQUE: Multiplanar, multiecho pulse sequences of the brain and spine and surrounding structures were obtained without intravenous contrast.  COMPARISON:  Prior CT from earlier the same day.  FINDINGS: MRI BRAIN:  Study is degraded by motion artifact.  Diffuse prominence of the CSF containing spaces is compatible with generalized atrophy. Patchy and confluent T2/FLAIR hyperintensity within the periventricular and deep white matter both cerebral hemispheres most consistent with chronic small vessel ischemic changes.  No acute infarct. No mass lesion, midline shift, or mass effect. No hydrocephalus. No extra-axial fluid collection. Normal intravascular flow voids maintained. No acute or chronic intracranial hemorrhage.  Orbits within normal limits. Paranasal sinuses are clear. No mastoid effusion. Inner ear structures normal.  Bone marrow signal intensity within normal limits.  MRI CERVICAL SPINE:  Craniocervical junction is widely patent.  There is reversal of the normal cervical lordosis with apex at C3. There is 3 mm anterolisthesis of C2 on C3, with 3 mm retrolisthesis of C3 on C4. Trace anterolisthesis of C5 on C6 and C7 on T1.  Vertebral body heights are maintained. No fracture. Heterogeneous degenerative disc disease present about the C3-4 intervertebral disc space. Otherwise vertebral body bone marrow signal intensity is normal  Signal intensity within the cervical spinal cord is normal. Spinal cord is kinked at the level of C3-4.  Paraspinous soft tissues within normal limits. Normal intravascular flow voids present within the  vertebral arteries.  C2-C3: Mild diffuse degenerative disc osteophyte with bilateral uncovertebral spurring. There is resultant moderate right with mild left foraminal stenosis. Posterior disc osteophyte partially effaces the ventral thecal sac and results in mild canal stenosis.  C3-C4: Diffuse degenerative disc osteophyte with degenerative endplate changes, bilateral uncovertebral spurring, and facet arthrosis. There is resultant severe right with moderate to severe left foraminal stenosis. There is severe canal narrowing with flattening of the cervical spinal cord. Thecal sac measures 7.7 mm in AP diameter. No associated cord signal changes.  C4-C5: Diffuse degenerative disc osteophyte with bilateral uncovertebral spurring, greater on the right. Superimposed mild facet hypertrophy. There is severe right with moderate to severe left foraminal stenosis. Posterior disc osteophyte largely effaces the ventral thecal sac and results in moderate to severe canal stenosis. Mild flattening of the cervical spinal cord without cord signal changes.  C5-C6: Bilateral uncovertebral spurring with prominent  right-sided facet arthrosis. There is resultant severe right foraminal narrowing. Mild to moderate left foraminal stenosis. Posterior disc osteophyte minimally effaces the ventral thecal sac with resultant mild canal stenosis.  C6-C7: Bilateral uncovertebral hypertrophy with mild facet arthrosis. Resultant moderate bilateral foraminal stenosis. Posterior disc osteophyte results in mild canal narrowing.  C7-T1: Bilateral uncovertebral hypertrophy with facet arthrosis. There is left greater than right mild canal narrowing. Left paracentral disc osteophyte minimally indents the ventral thecal sac with resultant mild canal narrowing.  MRI THORACIC SPINE:  Vertebral bodies are normally aligned with preservation of the normal thoracic kyphosis. No listhesis.  Chronic compression deformity of the T9 vertebral body noted. Associated  degenerative endplate Schmorl's node present at the superior endplate of T9. Otherwise, vertebral body heights are maintained. No acute fracture. No marrow edema. No focal osseous lesion.  Signal intensity within the thoracic spinal cord is normal.  Paraspinous soft tissues are within normal limits. Visualized lungs are grossly clear.  Tiny left paracentral disc protrusion at T5-6 without significant stenosis. Tiny central disc protrusion at T7-8 without stenosis. Minimal disc bulge at T8-9 without stenosis. Shallow right foraminal disc protrusion at T9-10 without stenosis or neural impingement. Mild disc bulge at T10-11 without significant stenosis.  MRI LUMBAR SPINE:  In correlation with the thoracic spine, there appears to be transitional lumbosacral anatomy with partial lumbarization of the S1 segment. Vertebral bodies are normally aligned with preservation of the normal lumbar lordosis. Vertebral body heights are maintained. No acute fracture. No bone marrow edema. No focal osseous lesion.  Conus medullaris terminates normally at the L1-2 level. Signal intensity within the visualized cord is normal.  Paraspinous soft tissues demonstrate no acute abnormality. Visualized intra-abdominal aorta within normal limits. T2 hyperintense cyst partially visualized within the left kidney.  L1-2: Degenerative disc desiccation present. Superimposed small central disc extrusion indents the ventral thecal sac without significant canal stenosis. Foramina remain widely patent.  L2-3: Degenerative disc desiccation with intervertebral disc space narrowing and mild diffuse disc bulge. Anterior endplate osteophytic spurring. No significant canal stenosis. Foramina remain widely patent.  L3-4: Mild diffuse disc bulge with disc desiccation. No focal disc herniation. No significant facet arthropathy. No canal or foraminal stenosis.  L4-5: Diffuse degenerative disc bulge with disc desiccation. No definite focal disc herniation.  Superimposed facet and ligamentous hypertrophy. There is resultant mild canal and right foraminal narrowing. Left neural foramen remains widely patent.  L5-S1: Degenerative disc desiccation with disc bulge. Superimposed shallow broad-based disc protrusion, slightly eccentric to the left. Superimposed facet and ligamentous hypertrophy. Protruding disc appears to contact the transiting left S1 nerve root as it courses through the lateral recess, and could potentially result in left lower extremity radiculopathy. There is mild canal and bilateral foraminal narrowing at this level.  IMPRESSION: MRI BRAIN:  1. No acute intracranial infarct or other process identified. 2. Generalized cerebral atrophy with mild to moderate chronic small vessel ischemic changes.  MRI CERVICAL SPINE:  1. Reversal of the normal cervical lordosis with apex at C3, resulting in severe canal stenosis at C3-4 and C4-5 with kinking of the spinal cord at these levels. No associated cord signal changes. 2. Additional multilevel degenerative disc disease with resultant foraminal narrowing as detailed above.  MRI THORACIC SPINE:  1. No acute abnormality within the thoracic spine. 2. Chronic compression deformity of the T9 vertebral body without significant retropulsion. 3. Mild multilevel degenerative disc disease as detailed above. No significant canal or foraminal stenosis.  MRI LUMBAR SPINE:  1. No acute  abnormality within the lumbar spine. No significant canal stenosis. 2. Shallow posterior disc protrusion at L5-S1, closely approximating the transiting left S1 nerve root in the left lateral recess. This could potentially result in left lower extremity radicular symptoms. 3. Small central disc extrusion at L1-2 without significant stenosis. 4. Additional mild degenerative changes as above. 5. Transitional lumbosacral anatomy. Careful correlation with imaging and plain film radiography recommended prior to any potential future planned surgical  intervention.   Electronically Signed   By: Rise Mu M.D.   On: 01/25/2015 23:40   Mr Thoracic Spine Wo Contrast  01/25/2015   CLINICAL DATA:  Initial evaluation for progressive decline, back pain, difficulty walking  EXAM: MRI HEAD WITHOUT CONTRAST  MRI CERVICAL SPINE WITHOUT CONTRAST  MRI THORACIC SPINE WITHOUT CONTRAST  MRI LUMBAR SPINE WITHOUT CONTRAST  TECHNIQUE: Multiplanar, multiecho pulse sequences of the brain and spine and surrounding structures were obtained without intravenous contrast.  COMPARISON:  Prior CT from earlier the same day.  FINDINGS: MRI BRAIN:  Study is degraded by motion artifact.  Diffuse prominence of the CSF containing spaces is compatible with generalized atrophy. Patchy and confluent T2/FLAIR hyperintensity within the periventricular and deep white matter both cerebral hemispheres most consistent with chronic small vessel ischemic changes.  No acute infarct. No mass lesion, midline shift, or mass effect. No hydrocephalus. No extra-axial fluid collection. Normal intravascular flow voids maintained. No acute or chronic intracranial hemorrhage.  Orbits within normal limits. Paranasal sinuses are clear. No mastoid effusion. Inner ear structures normal.  Bone marrow signal intensity within normal limits.  MRI CERVICAL SPINE:  Craniocervical junction is widely patent.  There is reversal of the normal cervical lordosis with apex at C3. There is 3 mm anterolisthesis of C2 on C3, with 3 mm retrolisthesis of C3 on C4. Trace anterolisthesis of C5 on C6 and C7 on T1.  Vertebral body heights are maintained. No fracture. Heterogeneous degenerative disc disease present about the C3-4 intervertebral disc space. Otherwise vertebral body bone marrow signal intensity is normal  Signal intensity within the cervical spinal cord is normal. Spinal cord is kinked at the level of C3-4.  Paraspinous soft tissues within normal limits. Normal intravascular flow voids present within the vertebral  arteries.  C2-C3: Mild diffuse degenerative disc osteophyte with bilateral uncovertebral spurring. There is resultant moderate right with mild left foraminal stenosis. Posterior disc osteophyte partially effaces the ventral thecal sac and results in mild canal stenosis.  C3-C4: Diffuse degenerative disc osteophyte with degenerative endplate changes, bilateral uncovertebral spurring, and facet arthrosis. There is resultant severe right with moderate to severe left foraminal stenosis. There is severe canal narrowing with flattening of the cervical spinal cord. Thecal sac measures 7.7 mm in AP diameter. No associated cord signal changes.  C4-C5: Diffuse degenerative disc osteophyte with bilateral uncovertebral spurring, greater on the right. Superimposed mild facet hypertrophy. There is severe right with moderate to severe left foraminal stenosis. Posterior disc osteophyte largely effaces the ventral thecal sac and results in moderate to severe canal stenosis. Mild flattening of the cervical spinal cord without cord signal changes.  C5-C6: Bilateral uncovertebral spurring with prominent right-sided facet arthrosis. There is resultant severe right foraminal narrowing. Mild to moderate left foraminal stenosis. Posterior disc osteophyte minimally effaces the ventral thecal sac with resultant mild canal stenosis.  C6-C7: Bilateral uncovertebral hypertrophy with mild facet arthrosis. Resultant moderate bilateral foraminal stenosis. Posterior disc osteophyte results in mild canal narrowing.  C7-T1: Bilateral uncovertebral hypertrophy with facet arthrosis. There is left  greater than right mild canal narrowing. Left paracentral disc osteophyte minimally indents the ventral thecal sac with resultant mild canal narrowing.  MRI THORACIC SPINE:  Vertebral bodies are normally aligned with preservation of the normal thoracic kyphosis. No listhesis.  Chronic compression deformity of the T9 vertebral body noted. Associated  degenerative endplate Schmorl's node present at the superior endplate of T9. Otherwise, vertebral body heights are maintained. No acute fracture. No marrow edema. No focal osseous lesion.  Signal intensity within the thoracic spinal cord is normal.  Paraspinous soft tissues are within normal limits. Visualized lungs are grossly clear.  Tiny left paracentral disc protrusion at T5-6 without significant stenosis. Tiny central disc protrusion at T7-8 without stenosis. Minimal disc bulge at T8-9 without stenosis. Shallow right foraminal disc protrusion at T9-10 without stenosis or neural impingement. Mild disc bulge at T10-11 without significant stenosis.  MRI LUMBAR SPINE:  In correlation with the thoracic spine, there appears to be transitional lumbosacral anatomy with partial lumbarization of the S1 segment. Vertebral bodies are normally aligned with preservation of the normal lumbar lordosis. Vertebral body heights are maintained. No acute fracture. No bone marrow edema. No focal osseous lesion.  Conus medullaris terminates normally at the L1-2 level. Signal intensity within the visualized cord is normal.  Paraspinous soft tissues demonstrate no acute abnormality. Visualized intra-abdominal aorta within normal limits. T2 hyperintense cyst partially visualized within the left kidney.  L1-2: Degenerative disc desiccation present. Superimposed small central disc extrusion indents the ventral thecal sac without significant canal stenosis. Foramina remain widely patent.  L2-3: Degenerative disc desiccation with intervertebral disc space narrowing and mild diffuse disc bulge. Anterior endplate osteophytic spurring. No significant canal stenosis. Foramina remain widely patent.  L3-4: Mild diffuse disc bulge with disc desiccation. No focal disc herniation. No significant facet arthropathy. No canal or foraminal stenosis.  L4-5: Diffuse degenerative disc bulge with disc desiccation. No definite focal disc herniation.  Superimposed facet and ligamentous hypertrophy. There is resultant mild canal and right foraminal narrowing. Left neural foramen remains widely patent.  L5-S1: Degenerative disc desiccation with disc bulge. Superimposed shallow broad-based disc protrusion, slightly eccentric to the left. Superimposed facet and ligamentous hypertrophy. Protruding disc appears to contact the transiting left S1 nerve root as it courses through the lateral recess, and could potentially result in left lower extremity radiculopathy. There is mild canal and bilateral foraminal narrowing at this level.  IMPRESSION: MRI BRAIN:  1. No acute intracranial infarct or other process identified. 2. Generalized cerebral atrophy with mild to moderate chronic small vessel ischemic changes.  MRI CERVICAL SPINE:  1. Reversal of the normal cervical lordosis with apex at C3, resulting in severe canal stenosis at C3-4 and C4-5 with kinking of the spinal cord at these levels. No associated cord signal changes. 2. Additional multilevel degenerative disc disease with resultant foraminal narrowing as detailed above.  MRI THORACIC SPINE:  1. No acute abnormality within the thoracic spine. 2. Chronic compression deformity of the T9 vertebral body without significant retropulsion. 3. Mild multilevel degenerative disc disease as detailed above. No significant canal or foraminal stenosis.  MRI LUMBAR SPINE:  1. No acute abnormality within the lumbar spine. No significant canal stenosis. 2. Shallow posterior disc protrusion at L5-S1, closely approximating the transiting left S1 nerve root in the left lateral recess. This could potentially result in left lower extremity radicular symptoms. 3. Small central disc extrusion at L1-2 without significant stenosis. 4. Additional mild degenerative changes as above. 5. Transitional lumbosacral anatomy. Careful correlation with  imaging and plain film radiography recommended prior to any potential future planned surgical  intervention.   Electronically Signed   By: Rise Mu M.D.   On: 01/25/2015 23:40   Mr Lumbar Spine Wo Contrast  01/25/2015   CLINICAL DATA:  Initial evaluation for progressive decline, back pain, difficulty walking  EXAM: MRI HEAD WITHOUT CONTRAST  MRI CERVICAL SPINE WITHOUT CONTRAST  MRI THORACIC SPINE WITHOUT CONTRAST  MRI LUMBAR SPINE WITHOUT CONTRAST  TECHNIQUE: Multiplanar, multiecho pulse sequences of the brain and spine and surrounding structures were obtained without intravenous contrast.  COMPARISON:  Prior CT from earlier the same day.  FINDINGS: MRI BRAIN:  Study is degraded by motion artifact.  Diffuse prominence of the CSF containing spaces is compatible with generalized atrophy. Patchy and confluent T2/FLAIR hyperintensity within the periventricular and deep white matter both cerebral hemispheres most consistent with chronic small vessel ischemic changes.  No acute infarct. No mass lesion, midline shift, or mass effect. No hydrocephalus. No extra-axial fluid collection. Normal intravascular flow voids maintained. No acute or chronic intracranial hemorrhage.  Orbits within normal limits. Paranasal sinuses are clear. No mastoid effusion. Inner ear structures normal.  Bone marrow signal intensity within normal limits.  MRI CERVICAL SPINE:  Craniocervical junction is widely patent.  There is reversal of the normal cervical lordosis with apex at C3. There is 3 mm anterolisthesis of C2 on C3, with 3 mm retrolisthesis of C3 on C4. Trace anterolisthesis of C5 on C6 and C7 on T1.  Vertebral body heights are maintained. No fracture. Heterogeneous degenerative disc disease present about the C3-4 intervertebral disc space. Otherwise vertebral body bone marrow signal intensity is normal  Signal intensity within the cervical spinal cord is normal. Spinal cord is kinked at the level of C3-4.  Paraspinous soft tissues within normal limits. Normal intravascular flow voids present within the vertebral  arteries.  C2-C3: Mild diffuse degenerative disc osteophyte with bilateral uncovertebral spurring. There is resultant moderate right with mild left foraminal stenosis. Posterior disc osteophyte partially effaces the ventral thecal sac and results in mild canal stenosis.  C3-C4: Diffuse degenerative disc osteophyte with degenerative endplate changes, bilateral uncovertebral spurring, and facet arthrosis. There is resultant severe right with moderate to severe left foraminal stenosis. There is severe canal narrowing with flattening of the cervical spinal cord. Thecal sac measures 7.7 mm in AP diameter. No associated cord signal changes.  C4-C5: Diffuse degenerative disc osteophyte with bilateral uncovertebral spurring, greater on the right. Superimposed mild facet hypertrophy. There is severe right with moderate to severe left foraminal stenosis. Posterior disc osteophyte largely effaces the ventral thecal sac and results in moderate to severe canal stenosis. Mild flattening of the cervical spinal cord without cord signal changes.  C5-C6: Bilateral uncovertebral spurring with prominent right-sided facet arthrosis. There is resultant severe right foraminal narrowing. Mild to moderate left foraminal stenosis. Posterior disc osteophyte minimally effaces the ventral thecal sac with resultant mild canal stenosis.  C6-C7: Bilateral uncovertebral hypertrophy with mild facet arthrosis. Resultant moderate bilateral foraminal stenosis. Posterior disc osteophyte results in mild canal narrowing.  C7-T1: Bilateral uncovertebral hypertrophy with facet arthrosis. There is left greater than right mild canal narrowing. Left paracentral disc osteophyte minimally indents the ventral thecal sac with resultant mild canal narrowing.  MRI THORACIC SPINE:  Vertebral bodies are normally aligned with preservation of the normal thoracic kyphosis. No listhesis.  Chronic compression deformity of the T9 vertebral body noted. Associated  degenerative endplate Schmorl's node present at the superior endplate of T9.  Otherwise, vertebral body heights are maintained. No acute fracture. No marrow edema. No focal osseous lesion.  Signal intensity within the thoracic spinal cord is normal.  Paraspinous soft tissues are within normal limits. Visualized lungs are grossly clear.  Tiny left paracentral disc protrusion at T5-6 without significant stenosis. Tiny central disc protrusion at T7-8 without stenosis. Minimal disc bulge at T8-9 without stenosis. Shallow right foraminal disc protrusion at T9-10 without stenosis or neural impingement. Mild disc bulge at T10-11 without significant stenosis.  MRI LUMBAR SPINE:  In correlation with the thoracic spine, there appears to be transitional lumbosacral anatomy with partial lumbarization of the S1 segment. Vertebral bodies are normally aligned with preservation of the normal lumbar lordosis. Vertebral body heights are maintained. No acute fracture. No bone marrow edema. No focal osseous lesion.  Conus medullaris terminates normally at the L1-2 level. Signal intensity within the visualized cord is normal.  Paraspinous soft tissues demonstrate no acute abnormality. Visualized intra-abdominal aorta within normal limits. T2 hyperintense cyst partially visualized within the left kidney.  L1-2: Degenerative disc desiccation present. Superimposed small central disc extrusion indents the ventral thecal sac without significant canal stenosis. Foramina remain widely patent.  L2-3: Degenerative disc desiccation with intervertebral disc space narrowing and mild diffuse disc bulge. Anterior endplate osteophytic spurring. No significant canal stenosis. Foramina remain widely patent.  L3-4: Mild diffuse disc bulge with disc desiccation. No focal disc herniation. No significant facet arthropathy. No canal or foraminal stenosis.  L4-5: Diffuse degenerative disc bulge with disc desiccation. No definite focal disc herniation.  Superimposed facet and ligamentous hypertrophy. There is resultant mild canal and right foraminal narrowing. Left neural foramen remains widely patent.  L5-S1: Degenerative disc desiccation with disc bulge. Superimposed shallow broad-based disc protrusion, slightly eccentric to the left. Superimposed facet and ligamentous hypertrophy. Protruding disc appears to contact the transiting left S1 nerve root as it courses through the lateral recess, and could potentially result in left lower extremity radiculopathy. There is mild canal and bilateral foraminal narrowing at this level.  IMPRESSION: MRI BRAIN:  1. No acute intracranial infarct or other process identified. 2. Generalized cerebral atrophy with mild to moderate chronic small vessel ischemic changes.  MRI CERVICAL SPINE:  1. Reversal of the normal cervical lordosis with apex at C3, resulting in severe canal stenosis at C3-4 and C4-5 with kinking of the spinal cord at these levels. No associated cord signal changes. 2. Additional multilevel degenerative disc disease with resultant foraminal narrowing as detailed above.  MRI THORACIC SPINE:  1. No acute abnormality within the thoracic spine. 2. Chronic compression deformity of the T9 vertebral body without significant retropulsion. 3. Mild multilevel degenerative disc disease as detailed above. No significant canal or foraminal stenosis.  MRI LUMBAR SPINE:  1. No acute abnormality within the lumbar spine. No significant canal stenosis. 2. Shallow posterior disc protrusion at L5-S1, closely approximating the transiting left S1 nerve root in the left lateral recess. This could potentially result in left lower extremity radicular symptoms. 3. Small central disc extrusion at L1-2 without significant stenosis. 4. Additional mild degenerative changes as above. 5. Transitional lumbosacral anatomy. Careful correlation with imaging and plain film radiography recommended prior to any potential future planned surgical  intervention.   Electronically Signed   By: Rise Mu M.D.   On: 01/25/2015 23:40   Ct Abdomen Pelvis W Contrast  01/26/2015   CLINICAL DATA:  Weight loss, failure to thrive  EXAM: CT CHEST, ABDOMEN, AND PELVIS WITH CONTRAST  TECHNIQUE: Multidetector CT  imaging of the chest, abdomen and pelvis was performed following the standard protocol during bolus administration of intravenous contrast.  CONTRAST:  75mL OMNIPAQUE IOHEXOL 300 MG/ML  SOLN  COMPARISON:  CT abdomen/ pelvis 11/24/2014. No prior similar imaging of the chest. Spine MRI 01/25/2015.  FINDINGS: CT CHEST FINDINGS  Mediastinum/Nodes: Moderate atheromatous aortic calcification without aneurysm. No lymphadenopathy. Great vessels are normal in caliber. Heart size is normal. No pericardial effusion. Representative 5 mm pretracheal node is identified image 24. No axillary lymphadenopathy.  Lungs/Pleura: Left maxillary sinus mucous retention cyst or polyp partly visualized. Diffuse emphysematous changes are noted within lungs. The lungs are clear. No pleural effusion.  Upper abdomen: Please see dedicated report below.  Musculoskeletal: No acute osseous abnormality.  CT ABDOMEN AND PELVIS FINDINGS  Lower chest:  Please see dedicated report above.  Hepatobiliary: 0.9 cm medial segment left hepatic lobe cyst. Liver is otherwise unremarkable. Gallbladder appears normal.  Pancreas: Normal  Spleen: Normal  Adrenals/Urinary Tract: Adrenal glands and kidneys appear normal. No radiopaque renal or ureteral calculus is identified. Depending calculi are noted within the bladder.  Stomach/Bowel: Mild stool burden. No bowel wall thickening or focal segmental dilatation is identified. The appendix is normal.  Vascular/Lymphatic: Atheromatous aortic calcification without aneurysm. No lymphadenopathy.  Musculoskeletal: Extensive lumbar spine disc degenerative change but no acute osseous abnormality. T9 compression deformity is unchanged since MRI 2 days ago.  Other:  No free air or fluid.  IMPRESSION: No acute intra-abdominal scratch but no acute abnormality within the chest, abdomen, or pelvis or other finding to explain the history of weight loss and failure to thrive.   Electronically Signed   By: Christiana Pellant M.D.   On: 01/26/2015 19:11    CBC  Recent Labs Lab 01/25/15 1500 01/26/15 0533 01/27/15 0524  WBC 10.4 8.9 8.4  HGB 15.4 14.5 14.4  HCT 44.6 42.4 41.8  PLT 225 177 187  MCV 87.3 87.4 87.1  MCH 30.1 29.9 30.0  MCHC 34.5 34.2 34.4  RDW 12.4 12.5 12.3  LYMPHSABS 1.5  --   --   MONOABS 0.9  --   --   EOSABS 0.0  --   --   BASOSABS 0.0  --   --     Chemistries   Recent Labs Lab 01/25/15 1500 01/26/15 0533 01/27/15 0524  NA 140 138 139  K 3.7 3.5 3.7  CL 98* 99* 101  CO2 33* 30 30  GLUCOSE 125* 103* 100*  BUN CREATININE 1.18 1.12 0.90  CALCIUM 10.6* 9.6 9.9  AST 22  --   --   ALT 16*  --   --   ALKPHOS 67  --   --   BILITOT 0.9  --   --    ------------------------------------------------------------------------------------------------------------------ estimated creatinine clearance is 52.6 mL/min (by C-G formula based on Cr of 0.9). ------------------------------------------------------------------------------------------------------------------ No results for input(s): HGBA1C in the last 72 hours. ------------------------------------------------------------------------------------------------------------------ No results for input(s): CHOL, HDL, LDLCALC, TRIG, CHOLHDL, LDLDIRECT in the last 72 hours. ------------------------------------------------------------------------------------------------------------------  Recent Labs  01/26/15 0533  TSH 1.667   ------------------------------------------------------------------------------------------------------------------  Recent Labs  01/26/15 0533  VITAMINB12 536    Coagulation profile  Recent Labs Lab 01/26/15 0045  INR 1.07    No results for  input(s): DDIMER in the last 72 hours.  Cardiac Enzymes No results for input(s): CKMB, TROPONINI, MYOGLOBIN in the last 168 hours.  Invalid input(s): CK ------------------------------------------------------------------------------------------------------------------ Invalid input(s): POCBNP  No results for input(s): GLUCAP in the last  72 hours.   RAI,RIPUDEEP M.D. [facility] 01/27/2015, 12:22 PM  Pager: [phone]   Between 7am to 7pm - call Pager - [phone]  After 7pm go to [email or web address] - password [email or web address]  Call night coverage person covering after 7pm

## 2015-01-28 DIAGNOSIS — N400 Enlarged prostate without lower urinary tract symptoms: Secondary | ICD-10-CM | POA: Diagnosis not present

## 2015-01-28 DIAGNOSIS — R269 Unspecified abnormalities of gait and mobility: Secondary | ICD-10-CM

## 2015-01-28 DIAGNOSIS — R627 Adult failure to thrive: Secondary | ICD-10-CM | POA: Diagnosis not present

## 2015-01-28 DIAGNOSIS — R29898 Other symptoms and signs involving the musculoskeletal system: Secondary | ICD-10-CM

## 2015-01-28 DIAGNOSIS — G609 Hereditary and idiopathic neuropathy, unspecified: Secondary | ICD-10-CM | POA: Diagnosis not present

## 2015-01-28 LAB — BASIC METABOLIC PANEL
ANION GAP: 10 (ref 5–15)
BUN: 11 mg/dL (ref 6–20)
CALCIUM: 9.6 mg/dL (ref 8.9–10.3)
CHLORIDE: 98 mmol/L — AB (ref 101–111)
CO2: 27 mmol/L (ref 22–32)
CREATININE: 1.01 mg/dL (ref 0.61–1.24)
GFR calc non Af Amer: >60 mL/min (ref >60)
Glucose, Bld: 121 mg/dL — ABNORMAL HIGH (ref 65–99)
Potassium: 3.8 mmol/L (ref 3.5–5.1)
Sodium: 135 mmol/L (ref 135–145)

## 2015-01-28 LAB — CBC
HCT: 42.2 % (ref 39.0–52.0)
Hemoglobin: 14.5 g/dL (ref 13.0–17.0)
MCH: 29.8 pg (ref 26.0–34.0)
MCHC: 34.4 g/dL (ref 30.0–36.0)
MCV: 86.7 fL (ref 78.0–100.0)
Platelets: 190 10*3/uL (ref 150–400)
RBC: 4.87 MIL/uL (ref 4.22–5.81)
RDW: 12.2 % (ref 11.5–15.5)
WBC: 9.3 10*3/uL (ref 4.0–10.5)

## 2015-01-28 LAB — FOLATE RBC
Folate, Hemolysate: 532.8 ng/mL
Folate, RBC: 1260 ng/mL (ref >498)
Hematocrit: 42.3 % (ref 37.5–51.0)

## 2015-01-28 MED ORDER — ENSURE ENLIVE PO LIQD
237.0000 mL | Freq: Two times a day (BID) | ORAL | Status: DC
  Administered 2015-01-28 – 2015-01-29 (×3): 237 mL via ORAL

## 2015-01-28 NOTE — Progress Notes (Signed)
Nutrition Follow-up  DOCUMENTATION CODES:  Non-severe (moderate) malnutrition in context of chronic illness  INTERVENTION:  Ensure Enlive (each supplement provides 350kcal and 20 grams of protein)  NUTRITION DIAGNOSIS:  Inadequate oral intake related to other (see comment) (weakness, poor appeite, inadequate support) as evidenced by energy intake < or equal to 75% for > or equal to 1 month.  Ongoing  GOAL:  Patient will meet greater than or equal to 90% of their needs  Progressing  MONITOR:  PO intake, Supplement acceptance, Diet advancement, Labs, I & O's  REASON FOR ASSESSMENT:  Consult Assessment of nutrition requirement/status  ASSESSMENT: 74 y.o. male PMHx Nephrolithiasis, BPH, Gout. Presents with weakness and stabbing pain and tingling in both of his legs for 2 weeks. Pt has been unable to walk. Poor PO intake and weight loss for the past month (reports 20 lb in 30 days).  RD received consult for assessment of needs. Full initial assessment completed on 01/26/15.   SLP evaluated pt and recommended a regular consistency diet with thin liquids. Pt is currently on a Heart Healthy diet and tolerating we;; PO: 75-100%.   Pt sleeping soundly at time of visit. Resource Breeze supplement unopened at bedside table. RD will add Ensure supplement for increased nutrient provision.   CSW following. Discharge disposition is for SNF.   Height:  Ht Readings from Last 1 Encounters:  01/26/15 5\' 4"  (1.626 m)    Weight:  Wt Readings from Last 1 Encounters:  01/28/15 108 lb 4.8 oz (49.125 kg)    Ideal Body Weight:  59.1 kg  Wt Readings from Last 10 Encounters:  01/28/15 108 lb 4.8 oz (49.125 kg)  11/30/14 110 lb 4 oz (50.009 kg)  08/08/12 108 lb 6.4 oz (49.17 kg)  06/30/12 120 lb (54.432 kg)  06/14/12 145 lb (65.772 kg)    BMI:  Body mass index is 18.58 kg/(m^2).  Estimated Nutritional Needs:  Kcal:  1600-1700 kcals (33-35 kcal/kg)  Protein:  60-70 g (1.2-14  g/kg)  Fluid:  1.6-1.7 liters  Skin:  Reviewed, no issues  Diet Order:  Diet Heart Room service appropriate?: Yes; Fluid consistency:: Thin  EDUCATION NEEDS:  No education needs identified at this time   Intake/Output Summary (Last 24 hours) at 01/28/15 1524 Last data filed at 01/28/15 1502  Gross per 24 hour  Intake    580 ml  Output    902 ml  Net   -322 ml    Last BM:  6/201/6  Brian Salinas A. Mayford Knife, RD, LDN, CDE Pager: [phone] After hours Pager: [phone]

## 2015-01-28 NOTE — Progress Notes (Signed)
Physical Therapy Treatment Patient Details Name: Brian Salinas MRN: [id number] DOB: 08-31-1940 Today's Date: 01/28/2015    History of Present Illness Brian Salinas is a 74 y.o. male with a history of Nephrolithiasis, BPH, Gout who was seen by Neurology in [facility] this AM due to weakness and stabbing pain and tingling in both of his legs for 2 weeks. He was seen and referred to the ED for further evaluation since he had been unable to walk, and has had progressive decline and weight loss for the past month. 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     PT Comments    Pt admitted with above diagnosis. Pt currently with functional limitations due to balance and endurance deficits.Pt ambulating with poor safety awareness. Needs continued PT.   Pt will benefit from skilled PT to increase their independence and safety with mobility to allow discharge to the venue listed below.    Follow Up Recommendations  CIR     Equipment Recommendations  Other (comment) (TBD)    Recommendations for Other Services OT consult;Speech consult     Precautions / Restrictions Precautions Precautions: Fall Restrictions Weight Bearing Restrictions: No    Mobility  Bed Mobility Overal bed mobility: Needs Assistance Bed Mobility: Supine to Sit     Supine to sit: Min assist     General bed mobility comments: min A for anterior translation of hips to EOB  Transfers Overall transfer level: Needs assistance Equipment used: 1 person hand held assist Transfers: Sit to/from [employer] Sit to Stand: Min assist Stand pivot transfers: +2 safety/equipment;Min assist       General transfer comment: bilateral HHA for stand pivot transfer, slight left lean with inital  standing. Assited pt onto and off 3N1 twice.    Ambulation/Gait Ambulation/Gait assistance: Min assist Ambulation Distance (Feet): 165 Feet Assistive device: Rolling walker (2 wheeled) Gait Pattern/deviations: Step-through pattern;Decreased stride length;Trunk flexed   Gait velocity interpretation: <1.8 ft/sec, indicative of risk for recurrent falls General Gait Details: Vcues to stay close to RW, forward flexed posture, Some assist with steering RW as well. Slightly unsteady at times.    Stairs            Wheelchair Mobility    Modified Rankin (Stroke Patients Only)       Balance Overall balance assessment: Needs assistance Sitting-balance support: No upper extremity supported;Feet supported Sitting balance-Leahy Scale: Fair Sitting balance - Comments: no LOB when sitting EOB   Standing balance support: Bilateral upper extremity supported;During functional activity Standing balance-Leahy Scale: Poor Standing balance comment: requires UE support to maintain standing.  unsteady without RW with diminished balance reactions.                     Cognition Arousal/Alertness: Awake/alert Behavior During Therapy: Flat affect Overall Cognitive Status: Impaired/Different from baseline Area of Impairment: Following commands;Memory;Problem solving;Safety/judgement;Awareness     Memory: Decreased short-term memory Following Commands: Follows one step commands with increased time Safety/Judgement: Decreased awareness of safety;Decreased awareness of deficits Awareness: Emergent Problem Solving: Slow processing;Requires verbal cues General Comments: unsure of baseline dcognitive status. most likely baseline    Exercises      General Comments        Pertinent Vitals/Pain Pain Assessment: No/denies  pain  VSS    Home Living                      Prior Function            PT Goals (current goals can now be found in the care plan section) Progress  towards PT goals: Progressing toward goals    Frequency  Min 3X/week    PT Plan Current plan remains appropriate    Co-evaluation             End of Session Equipment Utilized During Treatment: Gait belt Activity Tolerance: Patient limited by fatigue Patient left: in bed;with call bell/phone within reach;with bed alarm set     Time: 1610-9604 PT Time Calculation (min) (ACUTE ONLY): 18 min  Charges:  $Gait Training: 8-22 mins                    G Codes:      Berline Lopes 02-12-15, 5:22 PM Irina Okelly,PT Acute Rehabilitation [phone] (pager)

## 2015-01-28 NOTE — Progress Notes (Signed)
We will complete rehab consult today and I will follow up. [phone]

## 2015-01-28 NOTE — Progress Notes (Signed)
Occupational Therapy Evaluation Patient Details Name: Brian Salinas MRN: [id number] DOB: 1941-01-01 Today's Date: 01/28/2015    History of Present Illness Brian Salinas is a 74 y.o. male with a history of Nephrolithiasis, BPH, Gout who was seen by Neurology in [facility] this AM due to weakness and stabbing pain and tingling in both of his legs for 2 weeks. He was seen and referred to the ED for further evaluation since he had been unable to walk, and has had progressive decline and weight loss for the past month. 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    Clinical Impression   PTA, pt independent with mobility and ADL. Lives with son who assists with IADL tasks. Pt presents with deficits listed below and would benefit from  CIR to return to PLOF and facilitate safe return home @ mod I level. Will follow acutely to address established goals.     Follow Up Recommendations  CIR;Supervision/Assistance - 24 hour    Equipment Recommendations  3 in 1 bedside comode;Tub/shower bench    Recommendations for Other Services Rehab consult     Precautions / Restrictions Precautions Precautions: Fall Restrictions Weight Bearing Restrictions: No      Mobility Bed Mobility Overal bed mobility: Needs Assistance Bed Mobility: Supine to Sit     Supine to sit: Min assist     General bed mobility comments: min A for anterior translation of hips to EOB  Transfers Overall transfer level: Needs assistance Equipment used: 1 person hand held assist Transfers: Sit to/from [employer] Sit to Stand: Min assist              Balance     Sitting balance-Leahy Scale: Fair       Standing balance-Leahy Scale: Poor                    heavy  reliance on furning          ADL Overall ADL's : Needs assistance/impaired Eating/Feeding: Modified independent   Grooming: Set up   Upper Body Bathing: Set up;Sitting   Lower Body Bathing: Moderate assistance;Sit to/from stand   Upper Body Dressing : Set up;Sitting   Lower Body Dressing: Moderate assistance;Sit to/from stand   Toilet Transfer: Minimal assistance;BSC;Stand-pivot   Toileting- Clothing Manipulation and Hygiene: Minimal assistance;Sitting/lateral lean       Functional mobility during ADLs: Minimal assistance;Cueing for safety; reliance on furniture walking. Pt states that he did not ambulate this way PTA General ADL Comments: slow rigid movements                     Pertinent Vitals/Pain Pain Assessment: 0-10 Pain Score: 2  Pain Location: back Pain Descriptors / Indicators: Aching Pain Intervention(s): Limited activity within patient's tolerance     Hand Dominance Right   Extremity/Trunk Assessment Upper Extremity Assessment Upper Extremity Assessment: Generalized weakness   Lower Extremity Assessment Lower Extremity Assessment: Defer to PT evaluation   Cervical / Trunk Assessment Cervical / Trunk Assessment: Kyphotic   Communication Communication Communication: Other (comment) (difficult to understand without dentures)   Cognition Arousal/Alertness: Awake/alert Behavior During Therapy: Flat affect   Area of Impairment: Following commands;Memory;Problem solving;Safety/judgement;Awareness     Memory: Decreased short-term memory Following Commands: Follows  one step commands with increased time Safety/Judgement: Decreased awareness of safety;Decreased awareness of deficits Awareness: Emergent Problem Solving: Slow processing;Requires verbal cues General Comments: unsure of baseline dcognitive status. most likely baseline                      Home Living Family/patient expects to be discharged to:: Private residence Living  Arrangements: Children Available Help at Discharge: Family Type of Home: House Home Access: Stairs to enter Secretary/administrator of Steps: 6 Entrance Stairs-Rails: Right Home Layout: One level     Bathroom Shower/Tub: Tub/shower unit Shower/tub characteristics: Engineer, building services: Standard Bathroom Accessibility: Yes How Accessible: Accessible via walker Home Equipment: Grab bars - tub/shower   Additional Comments: pt lives with son, is alone when son at work. Spoke with pt's brother who reports that pt's health and function has been declining over past year but had kidney stone surgery a month ago and has significantly declined since that time; son helped with IADL tasls      Prior Functioning/Environment Level of Independence: Independent        Comments: was independent at home until a week or so ago when he started having difficulty ambulating, was very slow, and has lost 10 lbs    OT Diagnosis: Generalized weakness;Cognitive deficits   OT Problem List: Decreased strength;Decreased range of motion;Decreased activity tolerance;Impaired balance (sitting and/or standing);Decreased coordination;Decreased safety awareness;Decreased cognition;Decreased knowledge of use of DME or AE;Impaired tone;Pain   OT Treatment/Interventions: Self-care/ADL training;Therapeutic exercise;DME and/or AE instruction;Therapeutic activities;Cognitive remediation/compensation;Patient/family education;Balance training    OT Goals(Current goals can be found in the care plan section) Acute Rehab OT Goals Patient Stated Goal: to go home OT Goal Formulation: With patient Time For Goal Achievement: 02/11/15 Potential to Achieve Goals: Good  OT Frequency: Min 2X/week   Barriers to D/C:            Co-evaluation              End of Session Nurse Communication: Mobility status  Activity Tolerance: Patient tolerated treatment well Patient left: in chair;with call bell/phone within  reach;with chair alarm set   Time: [phone] OT Time Calculation (min): 31 min Charges:  OT General Charges $OT Visit: 1 Procedure OT Evaluation $Initial OT Evaluation Tier I: 1 Procedure OT Treatments $Self Care/Home Management : 8-22 mins G-Codes:    Foday Cone,HILLARY 02/19/15, 9:18 AM   Luisa Dago, OTR/L  [phone] 02/19/2015

## 2015-01-28 NOTE — Progress Notes (Signed)
[facility]                                                                              Patient Demographics  Brian Salinas, is a 74 y.o. male, DOB - 28-Nov-1940, ZOX:[id number]  Admit date - 01/25/2015   Admitting Physician Ron Parker, MD  Outpatient Primary MD for the patient is Sid Falcon, MD  LOS - 2   Chief Complaint  Patient presents with  . Back Pain  . Leg Pain  . Constipation       Brief HPI   Brian Salinas is a 74 y.o. male with a history of Nephrolithiasis, BPH, Gout who was seen by Neurology in [facility] this AM due to weakness and stabbing pain and tingling in both of his legs for 2 weeks. He was seen and referred to the ED for further evaluation since he had been unable to walk, and has had progressive decline and weight loss for the past month. He has had poor intake of foods and liquids and a reported loss of 20 pounds in 30 days. He was seen by Neurology Dr. Hosie Poisson in the ED and was sent for MRIs of the C-spine, T-Spine and LS Spine and referred for further evaluation.    Assessment & Plan    Principal Problem:   Stiffness, cervical spondylosis, stenosis, cervical myelopathy Weakness of both legs - Unclear etiology likely due to degenerative disc disease, malnutrition - Neurology consulted, MRI of the C-spine, through lumbar spine done, showed C-Spine Kink, Compression Fx of T-9, and DDD and Disk protrusions L5-S1 - TSH 1.67, B12 normal - Appreciate neurology and neurosurgery conditions, per neurosurgery, Dr. Lovell Sheehan, patient may have bit of a cervical myelopathy however small part of his clinical syndrome, no need of any neurosurgery at this time - PT eval recommending inpatient rehabilitation, consult placed   Active Problems:    Adult failure to thrive, Severe protein-calorie malnutrition, left-sided abdominal pain - Patient did not complain any abdominal pain today - Swallow evaluation done, placed on regular  diet - CT abdomen and pelvis, showed no acute abdominal pathology - Due to smoking history, CT chest was obtained, showed no malignancy - Nutrition consult placed     Hypotension, Dehydration and UA positive for ketones - Continue gentle hydration, IV fluids     Hypercalcemia - Currently improving, continue IV fluid hydration     BPH (benign prostatic hyperplasia) -Continue Proscar and Flomax   Code Status: Full code  Family Communication: Discussed in detail with the patient, all imaging results, lab results explained to the patient, explained in detail to patient's sons yesterday   Disposition Plan : CIR   Time Spent in minutes  20 minutes  Procedures  Mri Cervical spine, thoracic spine, lumbar spine   CT chest and abdomen  Consults   Neuro  neurosurgery  DVT Prophylaxis   Lovenox  Medications  Scheduled Meds: . docusate sodium  100 mg Oral BID  . enoxaparin (LOVENOX) injection  30 mg Subcutaneous Q24H  . feeding supplement (RESOURCE BREEZE)  1 Container Oral TID BM  . finasteride  5 mg Oral Daily  .  pantoprazole  40 mg Oral Daily  . polyethylene glycol  17 g Oral Daily  . senna-docusate  1 tablet Oral BID  . sodium chloride  3 mL Intravenous Q12H  . tamsulosin  0.8 mg Oral Daily   Continuous Infusions: . sodium chloride Stopped (01/26/15 1624)   PRN Meds:.acetaminophen **OR** acetaminophen, alum & mag hydroxide-simeth, morphine injection, ondansetron **OR** ondansetron (ZOFRAN) IV, oxyCODONE   Antibiotics   Anti-infectives    None        Subjective:   [name] was seen and examined today. Feels better, more alert and awake, overall improving, no acute events overnight, no fevers. No complaints of any pain. Denies any chest pain, shortness of breath, fevers or chills, nausea or vomiting.   Objective:   Blood pressure 99/64, pulse 88, temperature 98.4 F (36.9 C), temperature source Oral, resp. rate 16, height 5\' 4"  (1.626 m), weight 49.125 kg  (108 lb 4.8 oz), SpO2 96 %.  Wt Readings from Last 3 Encounters:  01/28/15 49.125 kg (108 lb 4.8 oz)  11/30/14 50.009 kg (110 lb 4 oz)  08/08/12 49.17 kg (108 lb 6.4 oz)     Intake/Output Summary (Last 24 hours) at 01/28/15 1218 Last data filed at 01/28/15 0904  Gross per 24 hour  Intake    360 ml  Output    976 ml  Net   -616 ml    Exam  General: Alert and oriented x 3 NAD  HEENT:  PERRLA, EOMI  Neck: Supple, no JVD, no masses  CVS: S1 S2 clear RRR  Respiratory: CTAB  Abdomen: Soft,NT, ND, NBS  Ext: no cyanosis clubbing or edema  Neuro: AAOx3, Cr N's II- XII. Strength 4/5 upper and lower extremities bilaterally, stiffness improving   Skin: No rashes  Psych: Normal affect and demeanor, alert and oriented x3   Data Review   Micro Results No results found for this or any previous visit (from the past 240 hour(s)).  Radiology Reports Ct Head Wo Contrast  01/25/2015   CLINICAL DATA:  Bilateral lower extremity numbness for 2-3 weeks.  EXAM: CT HEAD WITHOUT CONTRAST  TECHNIQUE: Contiguous axial images were obtained from the base of the skull through the vertex without intravenous contrast.  COMPARISON:  CT scan of August 05, 2012.  FINDINGS: Bony calvarium appears intact. Minimal diffuse cortical atrophy is noted. Mild chronic ischemic white matter disease is noted. No mass effect or midline shift is noted. Ventricular size is within normal limits. There is no evidence of mass lesion, hemorrhage or acute infarction.  IMPRESSION: Minimal diffuse cortical atrophy. Mild chronic ischemic white matter disease. No acute intracranial abnormality seen.   Electronically Signed   By: Lupita Raider, M.D.   On: 01/25/2015 19:53   Ct Chest W Contrast  01/26/2015   CLINICAL DATA:  Weight loss, failure to thrive  EXAM: CT CHEST, ABDOMEN, AND PELVIS WITH CONTRAST  TECHNIQUE: Multidetector CT imaging of the chest, abdomen and pelvis was performed following the standard protocol during  bolus administration of intravenous contrast.  CONTRAST:  75mL OMNIPAQUE IOHEXOL 300 MG/ML  SOLN  COMPARISON:  CT abdomen/ pelvis 11/24/2014. No prior similar imaging of the chest. Spine MRI 01/25/2015.  FINDINGS: CT CHEST FINDINGS  Mediastinum/Nodes: Moderate atheromatous aortic calcification without aneurysm. No lymphadenopathy. Great vessels are normal in caliber. Heart size is normal. No pericardial effusion. Representative 5 mm pretracheal node is identified image 24. No axillary lymphadenopathy.  Lungs/Pleura: Left maxillary sinus mucous retention cyst or polyp partly visualized.  Diffuse emphysematous changes are noted within lungs. The lungs are clear. No pleural effusion.  Upper abdomen: Please see dedicated report below.  Musculoskeletal: No acute osseous abnormality.  CT ABDOMEN AND PELVIS FINDINGS  Lower chest:  Please see dedicated report above.  Hepatobiliary: 0.9 cm medial segment left hepatic lobe cyst. Liver is otherwise unremarkable. Gallbladder appears normal.  Pancreas: Normal  Spleen: Normal  Adrenals/Urinary Tract: Adrenal glands and kidneys appear normal. No radiopaque renal or ureteral calculus is identified. Depending calculi are noted within the bladder.  Stomach/Bowel: Mild stool burden. No bowel wall thickening or focal segmental dilatation is identified. The appendix is normal.  Vascular/Lymphatic: Atheromatous aortic calcification without aneurysm. No lymphadenopathy.  Musculoskeletal: Extensive lumbar spine disc degenerative change but no acute osseous abnormality. T9 compression deformity is unchanged since MRI 2 days ago.  Other: No free air or fluid.  IMPRESSION: No acute intra-abdominal scratch but no acute abnormality within the chest, abdomen, or pelvis or other finding to explain the history of weight loss and failure to thrive.   Electronically Signed   By: Christiana Pellant M.D.   On: 01/26/2015 19:11   Mr Brain Wo Contrast  01/25/2015   CLINICAL DATA:  Initial evaluation for  progressive decline, back pain, difficulty walking  EXAM: MRI HEAD WITHOUT CONTRAST  MRI CERVICAL SPINE WITHOUT CONTRAST  MRI THORACIC SPINE WITHOUT CONTRAST  MRI LUMBAR SPINE WITHOUT CONTRAST  TECHNIQUE: Multiplanar, multiecho pulse sequences of the brain and spine and surrounding structures were obtained without intravenous contrast.  COMPARISON:  Prior CT from earlier the same day.  FINDINGS: MRI BRAIN:  Study is degraded by motion artifact.  Diffuse prominence of the CSF containing spaces is compatible with generalized atrophy. Patchy and confluent T2/FLAIR hyperintensity within the periventricular and deep white matter both cerebral hemispheres most consistent with chronic small vessel ischemic changes.  No acute infarct. No mass lesion, midline shift, or mass effect. No hydrocephalus. No extra-axial fluid collection. Normal intravascular flow voids maintained. No acute or chronic intracranial hemorrhage.  Orbits within normal limits. Paranasal sinuses are clear. No mastoid effusion. Inner ear structures normal.  Bone marrow signal intensity within normal limits.  MRI CERVICAL SPINE:  Craniocervical junction is widely patent.  There is reversal of the normal cervical lordosis with apex at C3. There is 3 mm anterolisthesis of C2 on C3, with 3 mm retrolisthesis of C3 on C4. Trace anterolisthesis of C5 on C6 and C7 on T1.  Vertebral body heights are maintained. No fracture. Heterogeneous degenerative disc disease present about the C3-4 intervertebral disc space. Otherwise vertebral body bone marrow signal intensity is normal  Signal intensity within the cervical spinal cord is normal. Spinal cord is kinked at the level of C3-4.  Paraspinous soft tissues within normal limits. Normal intravascular flow voids present within the vertebral arteries.  C2-C3: Mild diffuse degenerative disc osteophyte with bilateral uncovertebral spurring. There is resultant moderate right with mild left foraminal stenosis. Posterior disc  osteophyte partially effaces the ventral thecal sac and results in mild canal stenosis.  C3-C4: Diffuse degenerative disc osteophyte with degenerative endplate changes, bilateral uncovertebral spurring, and facet arthrosis. There is resultant severe right with moderate to severe left foraminal stenosis. There is severe canal narrowing with flattening of the cervical spinal cord. Thecal sac measures 7.7 mm in AP diameter. No associated cord signal changes.  C4-C5: Diffuse degenerative disc osteophyte with bilateral uncovertebral spurring, greater on the right. Superimposed mild facet hypertrophy. There is severe right with moderate to severe left  foraminal stenosis. Posterior disc osteophyte largely effaces the ventral thecal sac and results in moderate to severe canal stenosis. Mild flattening of the cervical spinal cord without cord signal changes.  C5-C6: Bilateral uncovertebral spurring with prominent right-sided facet arthrosis. There is resultant severe right foraminal narrowing. Mild to moderate left foraminal stenosis. Posterior disc osteophyte minimally effaces the ventral thecal sac with resultant mild canal stenosis.  C6-C7: Bilateral uncovertebral hypertrophy with mild facet arthrosis. Resultant moderate bilateral foraminal stenosis. Posterior disc osteophyte results in mild canal narrowing.  C7-T1: Bilateral uncovertebral hypertrophy with facet arthrosis. There is left greater than right mild canal narrowing. Left paracentral disc osteophyte minimally indents the ventral thecal sac with resultant mild canal narrowing.  MRI THORACIC SPINE:  Vertebral bodies are normally aligned with preservation of the normal thoracic kyphosis. No listhesis.  Chronic compression deformity of the T9 vertebral body noted. Associated degenerative endplate Schmorl's node present at the superior endplate of T9. Otherwise, vertebral body heights are maintained. No acute fracture. No marrow edema. No focal osseous lesion.   Signal intensity within the thoracic spinal cord is normal.  Paraspinous soft tissues are within normal limits. Visualized lungs are grossly clear.  Tiny left paracentral disc protrusion at T5-6 without significant stenosis. Tiny central disc protrusion at T7-8 without stenosis. Minimal disc bulge at T8-9 without stenosis. Shallow right foraminal disc protrusion at T9-10 without stenosis or neural impingement. Mild disc bulge at T10-11 without significant stenosis.  MRI LUMBAR SPINE:  In correlation with the thoracic spine, there appears to be transitional lumbosacral anatomy with partial lumbarization of the S1 segment. Vertebral bodies are normally aligned with preservation of the normal lumbar lordosis. Vertebral body heights are maintained. No acute fracture. No bone marrow edema. No focal osseous lesion.  Conus medullaris terminates normally at the L1-2 level. Signal intensity within the visualized cord is normal.  Paraspinous soft tissues demonstrate no acute abnormality. Visualized intra-abdominal aorta within normal limits. T2 hyperintense cyst partially visualized within the left kidney.  L1-2: Degenerative disc desiccation present. Superimposed small central disc extrusion indents the ventral thecal sac without significant canal stenosis. Foramina remain widely patent.  L2-3: Degenerative disc desiccation with intervertebral disc space narrowing and mild diffuse disc bulge. Anterior endplate osteophytic spurring. No significant canal stenosis. Foramina remain widely patent.  L3-4: Mild diffuse disc bulge with disc desiccation. No focal disc herniation. No significant facet arthropathy. No canal or foraminal stenosis.  L4-5: Diffuse degenerative disc bulge with disc desiccation. No definite focal disc herniation. Superimposed facet and ligamentous hypertrophy. There is resultant mild canal and right foraminal narrowing. Left neural foramen remains widely patent.  L5-S1: Degenerative disc desiccation with  disc bulge. Superimposed shallow broad-based disc protrusion, slightly eccentric to the left. Superimposed facet and ligamentous hypertrophy. Protruding disc appears to contact the transiting left S1 nerve root as it courses through the lateral recess, and could potentially result in left lower extremity radiculopathy. There is mild canal and bilateral foraminal narrowing at this level.  IMPRESSION: MRI BRAIN:  1. No acute intracranial infarct or other process identified. 2. Generalized cerebral atrophy with mild to moderate chronic small vessel ischemic changes.  MRI CERVICAL SPINE:  1. Reversal of the normal cervical lordosis with apex at C3, resulting in severe canal stenosis at C3-4 and C4-5 with kinking of the spinal cord at these levels. No associated cord signal changes. 2. Additional multilevel degenerative disc disease with resultant foraminal narrowing as detailed above.  MRI THORACIC SPINE:  1. No acute abnormality within the  thoracic spine. 2. Chronic compression deformity of the T9 vertebral body without significant retropulsion. 3. Mild multilevel degenerative disc disease as detailed above. No significant canal or foraminal stenosis.  MRI LUMBAR SPINE:  1. No acute abnormality within the lumbar spine. No significant canal stenosis. 2. Shallow posterior disc protrusion at L5-S1, closely approximating the transiting left S1 nerve root in the left lateral recess. This could potentially result in left lower extremity radicular symptoms. 3. Small central disc extrusion at L1-2 without significant stenosis. 4. Additional mild degenerative changes as above. 5. Transitional lumbosacral anatomy. Careful correlation with imaging and plain film radiography recommended prior to any potential future planned surgical intervention.   Electronically Signed   By: Rise Mu M.D.   On: 01/25/2015 23:40   Mr Cervical Spine Wo Contrast  01/25/2015   CLINICAL DATA:  Initial evaluation for progressive decline,  back pain, difficulty walking  EXAM: MRI HEAD WITHOUT CONTRAST  MRI CERVICAL SPINE WITHOUT CONTRAST  MRI THORACIC SPINE WITHOUT CONTRAST  MRI LUMBAR SPINE WITHOUT CONTRAST  TECHNIQUE: Multiplanar, multiecho pulse sequences of the brain and spine and surrounding structures were obtained without intravenous contrast.  COMPARISON:  Prior CT from earlier the same day.  FINDINGS: MRI BRAIN:  Study is degraded by motion artifact.  Diffuse prominence of the CSF containing spaces is compatible with generalized atrophy. Patchy and confluent T2/FLAIR hyperintensity within the periventricular and deep white matter both cerebral hemispheres most consistent with chronic small vessel ischemic changes.  No acute infarct. No mass lesion, midline shift, or mass effect. No hydrocephalus. No extra-axial fluid collection. Normal intravascular flow voids maintained. No acute or chronic intracranial hemorrhage.  Orbits within normal limits. Paranasal sinuses are clear. No mastoid effusion. Inner ear structures normal.  Bone marrow signal intensity within normal limits.  MRI CERVICAL SPINE:  Craniocervical junction is widely patent.  There is reversal of the normal cervical lordosis with apex at C3. There is 3 mm anterolisthesis of C2 on C3, with 3 mm retrolisthesis of C3 on C4. Trace anterolisthesis of C5 on C6 and C7 on T1.  Vertebral body heights are maintained. No fracture. Heterogeneous degenerative disc disease present about the C3-4 intervertebral disc space. Otherwise vertebral body bone marrow signal intensity is normal  Signal intensity within the cervical spinal cord is normal. Spinal cord is kinked at the level of C3-4.  Paraspinous soft tissues within normal limits. Normal intravascular flow voids present within the vertebral arteries.  C2-C3: Mild diffuse degenerative disc osteophyte with bilateral uncovertebral spurring. There is resultant moderate right with mild left foraminal stenosis. Posterior disc osteophyte partially  effaces the ventral thecal sac and results in mild canal stenosis.  C3-C4: Diffuse degenerative disc osteophyte with degenerative endplate changes, bilateral uncovertebral spurring, and facet arthrosis. There is resultant severe right with moderate to severe left foraminal stenosis. There is severe canal narrowing with flattening of the cervical spinal cord. Thecal sac measures 7.7 mm in AP diameter. No associated cord signal changes.  C4-C5: Diffuse degenerative disc osteophyte with bilateral uncovertebral spurring, greater on the right. Superimposed mild facet hypertrophy. There is severe right with moderate to severe left foraminal stenosis. Posterior disc osteophyte largely effaces the ventral thecal sac and results in moderate to severe canal stenosis. Mild flattening of the cervical spinal cord without cord signal changes.  C5-C6: Bilateral uncovertebral spurring with prominent right-sided facet arthrosis. There is resultant severe right foraminal narrowing. Mild to moderate left foraminal stenosis. Posterior disc osteophyte minimally effaces the ventral thecal sac with  resultant mild canal stenosis.  C6-C7: Bilateral uncovertebral hypertrophy with mild facet arthrosis. Resultant moderate bilateral foraminal stenosis. Posterior disc osteophyte results in mild canal narrowing.  C7-T1: Bilateral uncovertebral hypertrophy with facet arthrosis. There is left greater than right mild canal narrowing. Left paracentral disc osteophyte minimally indents the ventral thecal sac with resultant mild canal narrowing.  MRI THORACIC SPINE:  Vertebral bodies are normally aligned with preservation of the normal thoracic kyphosis. No listhesis.  Chronic compression deformity of the T9 vertebral body noted. Associated degenerative endplate Schmorl's node present at the superior endplate of T9. Otherwise, vertebral body heights are maintained. No acute fracture. No marrow edema. No focal osseous lesion.  Signal intensity within  the thoracic spinal cord is normal.  Paraspinous soft tissues are within normal limits. Visualized lungs are grossly clear.  Tiny left paracentral disc protrusion at T5-6 without significant stenosis. Tiny central disc protrusion at T7-8 without stenosis. Minimal disc bulge at T8-9 without stenosis. Shallow right foraminal disc protrusion at T9-10 without stenosis or neural impingement. Mild disc bulge at T10-11 without significant stenosis.  MRI LUMBAR SPINE:  In correlation with the thoracic spine, there appears to be transitional lumbosacral anatomy with partial lumbarization of the S1 segment. Vertebral bodies are normally aligned with preservation of the normal lumbar lordosis. Vertebral body heights are maintained. No acute fracture. No bone marrow edema. No focal osseous lesion.  Conus medullaris terminates normally at the L1-2 level. Signal intensity within the visualized cord is normal.  Paraspinous soft tissues demonstrate no acute abnormality. Visualized intra-abdominal aorta within normal limits. T2 hyperintense cyst partially visualized within the left kidney.  L1-2: Degenerative disc desiccation present. Superimposed small central disc extrusion indents the ventral thecal sac without significant canal stenosis. Foramina remain widely patent.  L2-3: Degenerative disc desiccation with intervertebral disc space narrowing and mild diffuse disc bulge. Anterior endplate osteophytic spurring. No significant canal stenosis. Foramina remain widely patent.  L3-4: Mild diffuse disc bulge with disc desiccation. No focal disc herniation. No significant facet arthropathy. No canal or foraminal stenosis.  L4-5: Diffuse degenerative disc bulge with disc desiccation. No definite focal disc herniation. Superimposed facet and ligamentous hypertrophy. There is resultant mild canal and right foraminal narrowing. Left neural foramen remains widely patent.  L5-S1: Degenerative disc desiccation with disc bulge. Superimposed  shallow broad-based disc protrusion, slightly eccentric to the left. Superimposed facet and ligamentous hypertrophy. Protruding disc appears to contact the transiting left S1 nerve root as it courses through the lateral recess, and could potentially result in left lower extremity radiculopathy. There is mild canal and bilateral foraminal narrowing at this level.  IMPRESSION: MRI BRAIN:  1. No acute intracranial infarct or other process identified. 2. Generalized cerebral atrophy with mild to moderate chronic small vessel ischemic changes.  MRI CERVICAL SPINE:  1. Reversal of the normal cervical lordosis with apex at C3, resulting in severe canal stenosis at C3-4 and C4-5 with kinking of the spinal cord at these levels. No associated cord signal changes. 2. Additional multilevel degenerative disc disease with resultant foraminal narrowing as detailed above.  MRI THORACIC SPINE:  1. No acute abnormality within the thoracic spine. 2. Chronic compression deformity of the T9 vertebral body without significant retropulsion. 3. Mild multilevel degenerative disc disease as detailed above. No significant canal or foraminal stenosis.  MRI LUMBAR SPINE:  1. No acute abnormality within the lumbar spine. No significant canal stenosis. 2. Shallow posterior disc protrusion at L5-S1, closely approximating the transiting left S1 nerve root in the  left lateral recess. This could potentially result in left lower extremity radicular symptoms. 3. Small central disc extrusion at L1-2 without significant stenosis. 4. Additional mild degenerative changes as above. 5. Transitional lumbosacral anatomy. Careful correlation with imaging and plain film radiography recommended prior to any potential future planned surgical intervention.   Electronically Signed   By: Rise Mu M.D.   On: 01/25/2015 23:40   Mr Thoracic Spine Wo Contrast  01/25/2015   CLINICAL DATA:  Initial evaluation for progressive decline, back pain, difficulty  walking  EXAM: MRI HEAD WITHOUT CONTRAST  MRI CERVICAL SPINE WITHOUT CONTRAST  MRI THORACIC SPINE WITHOUT CONTRAST  MRI LUMBAR SPINE WITHOUT CONTRAST  TECHNIQUE: Multiplanar, multiecho pulse sequences of the brain and spine and surrounding structures were obtained without intravenous contrast.  COMPARISON:  Prior CT from earlier the same day.  FINDINGS: MRI BRAIN:  Study is degraded by motion artifact.  Diffuse prominence of the CSF containing spaces is compatible with generalized atrophy. Patchy and confluent T2/FLAIR hyperintensity within the periventricular and deep white matter both cerebral hemispheres most consistent with chronic small vessel ischemic changes.  No acute infarct. No mass lesion, midline shift, or mass effect. No hydrocephalus. No extra-axial fluid collection. Normal intravascular flow voids maintained. No acute or chronic intracranial hemorrhage.  Orbits within normal limits. Paranasal sinuses are clear. No mastoid effusion. Inner ear structures normal.  Bone marrow signal intensity within normal limits.  MRI CERVICAL SPINE:  Craniocervical junction is widely patent.  There is reversal of the normal cervical lordosis with apex at C3. There is 3 mm anterolisthesis of C2 on C3, with 3 mm retrolisthesis of C3 on C4. Trace anterolisthesis of C5 on C6 and C7 on T1.  Vertebral body heights are maintained. No fracture. Heterogeneous degenerative disc disease present about the C3-4 intervertebral disc space. Otherwise vertebral body bone marrow signal intensity is normal  Signal intensity within the cervical spinal cord is normal. Spinal cord is kinked at the level of C3-4.  Paraspinous soft tissues within normal limits. Normal intravascular flow voids present within the vertebral arteries.  C2-C3: Mild diffuse degenerative disc osteophyte with bilateral uncovertebral spurring. There is resultant moderate right with mild left foraminal stenosis. Posterior disc osteophyte partially effaces the ventral  thecal sac and results in mild canal stenosis.  C3-C4: Diffuse degenerative disc osteophyte with degenerative endplate changes, bilateral uncovertebral spurring, and facet arthrosis. There is resultant severe right with moderate to severe left foraminal stenosis. There is severe canal narrowing with flattening of the cervical spinal cord. Thecal sac measures 7.7 mm in AP diameter. No associated cord signal changes.  C4-C5: Diffuse degenerative disc osteophyte with bilateral uncovertebral spurring, greater on the right. Superimposed mild facet hypertrophy. There is severe right with moderate to severe left foraminal stenosis. Posterior disc osteophyte largely effaces the ventral thecal sac and results in moderate to severe canal stenosis. Mild flattening of the cervical spinal cord without cord signal changes.  C5-C6: Bilateral uncovertebral spurring with prominent right-sided facet arthrosis. There is resultant severe right foraminal narrowing. Mild to moderate left foraminal stenosis. Posterior disc osteophyte minimally effaces the ventral thecal sac with resultant mild canal stenosis.  C6-C7: Bilateral uncovertebral hypertrophy with mild facet arthrosis. Resultant moderate bilateral foraminal stenosis. Posterior disc osteophyte results in mild canal narrowing.  C7-T1: Bilateral uncovertebral hypertrophy with facet arthrosis. There is left greater than right mild canal narrowing. Left paracentral disc osteophyte minimally indents the ventral thecal sac with resultant mild canal narrowing.  MRI THORACIC SPINE:  Vertebral bodies are normally aligned with preservation of the normal thoracic kyphosis. No listhesis.  Chronic compression deformity of the T9 vertebral body noted. Associated degenerative endplate Schmorl's node present at the superior endplate of T9. Otherwise, vertebral body heights are maintained. No acute fracture. No marrow edema. No focal osseous lesion.  Signal intensity within the thoracic spinal  cord is normal.  Paraspinous soft tissues are within normal limits. Visualized lungs are grossly clear.  Tiny left paracentral disc protrusion at T5-6 without significant stenosis. Tiny central disc protrusion at T7-8 without stenosis. Minimal disc bulge at T8-9 without stenosis. Shallow right foraminal disc protrusion at T9-10 without stenosis or neural impingement. Mild disc bulge at T10-11 without significant stenosis.  MRI LUMBAR SPINE:  In correlation with the thoracic spine, there appears to be transitional lumbosacral anatomy with partial lumbarization of the S1 segment. Vertebral bodies are normally aligned with preservation of the normal lumbar lordosis. Vertebral body heights are maintained. No acute fracture. No bone marrow edema. No focal osseous lesion.  Conus medullaris terminates normally at the L1-2 level. Signal intensity within the visualized cord is normal.  Paraspinous soft tissues demonstrate no acute abnormality. Visualized intra-abdominal aorta within normal limits. T2 hyperintense cyst partially visualized within the left kidney.  L1-2: Degenerative disc desiccation present. Superimposed small central disc extrusion indents the ventral thecal sac without significant canal stenosis. Foramina remain widely patent.  L2-3: Degenerative disc desiccation with intervertebral disc space narrowing and mild diffuse disc bulge. Anterior endplate osteophytic spurring. No significant canal stenosis. Foramina remain widely patent.  L3-4: Mild diffuse disc bulge with disc desiccation. No focal disc herniation. No significant facet arthropathy. No canal or foraminal stenosis.  L4-5: Diffuse degenerative disc bulge with disc desiccation. No definite focal disc herniation. Superimposed facet and ligamentous hypertrophy. There is resultant mild canal and right foraminal narrowing. Left neural foramen remains widely patent.  L5-S1: Degenerative disc desiccation with disc bulge. Superimposed shallow broad-based  disc protrusion, slightly eccentric to the left. Superimposed facet and ligamentous hypertrophy. Protruding disc appears to contact the transiting left S1 nerve root as it courses through the lateral recess, and could potentially result in left lower extremity radiculopathy. There is mild canal and bilateral foraminal narrowing at this level.  IMPRESSION: MRI BRAIN:  1. No acute intracranial infarct or other process identified. 2. Generalized cerebral atrophy with mild to moderate chronic small vessel ischemic changes.  MRI CERVICAL SPINE:  1. Reversal of the normal cervical lordosis with apex at C3, resulting in severe canal stenosis at C3-4 and C4-5 with kinking of the spinal cord at these levels. No associated cord signal changes. 2. Additional multilevel degenerative disc disease with resultant foraminal narrowing as detailed above.  MRI THORACIC SPINE:  1. No acute abnormality within the thoracic spine. 2. Chronic compression deformity of the T9 vertebral body without significant retropulsion. 3. Mild multilevel degenerative disc disease as detailed above. No significant canal or foraminal stenosis.  MRI LUMBAR SPINE:  1. No acute abnormality within the lumbar spine. No significant canal stenosis. 2. Shallow posterior disc protrusion at L5-S1, closely approximating the transiting left S1 nerve root in the left lateral recess. This could potentially result in left lower extremity radicular symptoms. 3. Small central disc extrusion at L1-2 without significant stenosis. 4. Additional mild degenerative changes as above. 5. Transitional lumbosacral anatomy. Careful correlation with imaging and plain film radiography recommended prior to any potential future planned surgical intervention.   Electronically Signed   By: Janell Quiet.D.  On: 01/25/2015 23:40   Mr Lumbar Spine Wo Contrast  01/25/2015   CLINICAL DATA:  Initial evaluation for progressive decline, back pain, difficulty walking  EXAM: MRI HEAD  WITHOUT CONTRAST  MRI CERVICAL SPINE WITHOUT CONTRAST  MRI THORACIC SPINE WITHOUT CONTRAST  MRI LUMBAR SPINE WITHOUT CONTRAST  TECHNIQUE: Multiplanar, multiecho pulse sequences of the brain and spine and surrounding structures were obtained without intravenous contrast.  COMPARISON:  Prior CT from earlier the same day.  FINDINGS: MRI BRAIN:  Study is degraded by motion artifact.  Diffuse prominence of the CSF containing spaces is compatible with generalized atrophy. Patchy and confluent T2/FLAIR hyperintensity within the periventricular and deep white matter both cerebral hemispheres most consistent with chronic small vessel ischemic changes.  No acute infarct. No mass lesion, midline shift, or mass effect. No hydrocephalus. No extra-axial fluid collection. Normal intravascular flow voids maintained. No acute or chronic intracranial hemorrhage.  Orbits within normal limits. Paranasal sinuses are clear. No mastoid effusion. Inner ear structures normal.  Bone marrow signal intensity within normal limits.  MRI CERVICAL SPINE:  Craniocervical junction is widely patent.  There is reversal of the normal cervical lordosis with apex at C3. There is 3 mm anterolisthesis of C2 on C3, with 3 mm retrolisthesis of C3 on C4. Trace anterolisthesis of C5 on C6 and C7 on T1.  Vertebral body heights are maintained. No fracture. Heterogeneous degenerative disc disease present about the C3-4 intervertebral disc space. Otherwise vertebral body bone marrow signal intensity is normal  Signal intensity within the cervical spinal cord is normal. Spinal cord is kinked at the level of C3-4.  Paraspinous soft tissues within normal limits. Normal intravascular flow voids present within the vertebral arteries.  C2-C3: Mild diffuse degenerative disc osteophyte with bilateral uncovertebral spurring. There is resultant moderate right with mild left foraminal stenosis. Posterior disc osteophyte partially effaces the ventral thecal sac and results in  mild canal stenosis.  C3-C4: Diffuse degenerative disc osteophyte with degenerative endplate changes, bilateral uncovertebral spurring, and facet arthrosis. There is resultant severe right with moderate to severe left foraminal stenosis. There is severe canal narrowing with flattening of the cervical spinal cord. Thecal sac measures 7.7 mm in AP diameter. No associated cord signal changes.  C4-C5: Diffuse degenerative disc osteophyte with bilateral uncovertebral spurring, greater on the right. Superimposed mild facet hypertrophy. There is severe right with moderate to severe left foraminal stenosis. Posterior disc osteophyte largely effaces the ventral thecal sac and results in moderate to severe canal stenosis. Mild flattening of the cervical spinal cord without cord signal changes.  C5-C6: Bilateral uncovertebral spurring with prominent right-sided facet arthrosis. There is resultant severe right foraminal narrowing. Mild to moderate left foraminal stenosis. Posterior disc osteophyte minimally effaces the ventral thecal sac with resultant mild canal stenosis.  C6-C7: Bilateral uncovertebral hypertrophy with mild facet arthrosis. Resultant moderate bilateral foraminal stenosis. Posterior disc osteophyte results in mild canal narrowing.  C7-T1: Bilateral uncovertebral hypertrophy with facet arthrosis. There is left greater than right mild canal narrowing. Left paracentral disc osteophyte minimally indents the ventral thecal sac with resultant mild canal narrowing.  MRI THORACIC SPINE:  Vertebral bodies are normally aligned with preservation of the normal thoracic kyphosis. No listhesis.  Chronic compression deformity of the T9 vertebral body noted. Associated degenerative endplate Schmorl's node present at the superior endplate of T9. Otherwise, vertebral body heights are maintained. No acute fracture. No marrow edema. No focal osseous lesion.  Signal intensity within the thoracic spinal cord is normal.  Paraspinous  soft tissues are within normal limits. Visualized lungs are grossly clear.  Tiny left paracentral disc protrusion at T5-6 without significant stenosis. Tiny central disc protrusion at T7-8 without stenosis. Minimal disc bulge at T8-9 without stenosis. Shallow right foraminal disc protrusion at T9-10 without stenosis or neural impingement. Mild disc bulge at T10-11 without significant stenosis.  MRI LUMBAR SPINE:  In correlation with the thoracic spine, there appears to be transitional lumbosacral anatomy with partial lumbarization of the S1 segment. Vertebral bodies are normally aligned with preservation of the normal lumbar lordosis. Vertebral body heights are maintained. No acute fracture. No bone marrow edema. No focal osseous lesion.  Conus medullaris terminates normally at the L1-2 level. Signal intensity within the visualized cord is normal.  Paraspinous soft tissues demonstrate no acute abnormality. Visualized intra-abdominal aorta within normal limits. T2 hyperintense cyst partially visualized within the left kidney.  L1-2: Degenerative disc desiccation present. Superimposed small central disc extrusion indents the ventral thecal sac without significant canal stenosis. Foramina remain widely patent.  L2-3: Degenerative disc desiccation with intervertebral disc space narrowing and mild diffuse disc bulge. Anterior endplate osteophytic spurring. No significant canal stenosis. Foramina remain widely patent.  L3-4: Mild diffuse disc bulge with disc desiccation. No focal disc herniation. No significant facet arthropathy. No canal or foraminal stenosis.  L4-5: Diffuse degenerative disc bulge with disc desiccation. No definite focal disc herniation. Superimposed facet and ligamentous hypertrophy. There is resultant mild canal and right foraminal narrowing. Left neural foramen remains widely patent.  L5-S1: Degenerative disc desiccation with disc bulge. Superimposed shallow broad-based disc protrusion, slightly  eccentric to the left. Superimposed facet and ligamentous hypertrophy. Protruding disc appears to contact the transiting left S1 nerve root as it courses through the lateral recess, and could potentially result in left lower extremity radiculopathy. There is mild canal and bilateral foraminal narrowing at this level.  IMPRESSION: MRI BRAIN:  1. No acute intracranial infarct or other process identified. 2. Generalized cerebral atrophy with mild to moderate chronic small vessel ischemic changes.  MRI CERVICAL SPINE:  1. Reversal of the normal cervical lordosis with apex at C3, resulting in severe canal stenosis at C3-4 and C4-5 with kinking of the spinal cord at these levels. No associated cord signal changes. 2. Additional multilevel degenerative disc disease with resultant foraminal narrowing as detailed above.  MRI THORACIC SPINE:  1. No acute abnormality within the thoracic spine. 2. Chronic compression deformity of the T9 vertebral body without significant retropulsion. 3. Mild multilevel degenerative disc disease as detailed above. No significant canal or foraminal stenosis.  MRI LUMBAR SPINE:  1. No acute abnormality within the lumbar spine. No significant canal stenosis. 2. Shallow posterior disc protrusion at L5-S1, closely approximating the transiting left S1 nerve root in the left lateral recess. This could potentially result in left lower extremity radicular symptoms. 3. Small central disc extrusion at L1-2 without significant stenosis. 4. Additional mild degenerative changes as above. 5. Transitional lumbosacral anatomy. Careful correlation with imaging and plain film radiography recommended prior to any potential future planned surgical intervention.   Electronically Signed   By: Rise Mu M.D.   On: 01/25/2015 23:40   Ct Abdomen Pelvis W Contrast  01/26/2015   CLINICAL DATA:  Weight loss, failure to thrive  EXAM: CT CHEST, ABDOMEN, AND PELVIS WITH CONTRAST  TECHNIQUE: Multidetector CT  imaging of the chest, abdomen and pelvis was performed following the standard protocol during bolus administration of intravenous contrast.  CONTRAST:  75mL OMNIPAQUE IOHEXOL 300 MG/ML  SOLN  COMPARISON:  CT abdomen/ pelvis 11/24/2014. No prior similar imaging of the chest. Spine MRI 01/25/2015.  FINDINGS: CT CHEST FINDINGS  Mediastinum/Nodes: Moderate atheromatous aortic calcification without aneurysm. No lymphadenopathy. Great vessels are normal in caliber. Heart size is normal. No pericardial effusion. Representative 5 mm pretracheal node is identified image 24. No axillary lymphadenopathy.  Lungs/Pleura: Left maxillary sinus mucous retention cyst or polyp partly visualized. Diffuse emphysematous changes are noted within lungs. The lungs are clear. No pleural effusion.  Upper abdomen: Please see dedicated report below.  Musculoskeletal: No acute osseous abnormality.  CT ABDOMEN AND PELVIS FINDINGS  Lower chest:  Please see dedicated report above.  Hepatobiliary: 0.9 cm medial segment left hepatic lobe cyst. Liver is otherwise unremarkable. Gallbladder appears normal.  Pancreas: Normal  Spleen: Normal  Adrenals/Urinary Tract: Adrenal glands and kidneys appear normal. No radiopaque renal or ureteral calculus is identified. Depending calculi are noted within the bladder.  Stomach/Bowel: Mild stool burden. No bowel wall thickening or focal segmental dilatation is identified. The appendix is normal.  Vascular/Lymphatic: Atheromatous aortic calcification without aneurysm. No lymphadenopathy.  Musculoskeletal: Extensive lumbar spine disc degenerative change but no acute osseous abnormality. T9 compression deformity is unchanged since MRI 2 days ago.  Other: No free air or fluid.  IMPRESSION: No acute intra-abdominal scratch but no acute abnormality within the chest, abdomen, or pelvis or other finding to explain the history of weight loss and failure to thrive.   Electronically Signed   By: Christiana Pellant M.D.   On:  01/26/2015 19:11    CBC  Recent Labs Lab 01/25/15 1500 01/26/15 0533 01/27/15 0524 01/28/15 0506  WBC 10.4 8.9 8.4 9.3  HGB 15.4 14.5 14.4 14.5  HCT 44.6 42.4 41.8 42.2  PLT 225 177 187 190  MCV 87.3 87.4 87.1 86.7  MCH 30.1 29.9 30.0 29.8  MCHC 34.5 34.2 34.4 34.4  RDW 12.4 12.5 12.3 12.2  LYMPHSABS 1.5  --   --   --   MONOABS 0.9  --   --   --   EOSABS 0.0  --   --   --   BASOSABS 0.0  --   --   --     Chemistries   Recent Labs Lab 01/25/15 1500 01/26/15 0533 01/27/15 0524 01/28/15 0506  NA 140 138 139 135  K 3.7 3.5 3.7 3.8  CL 98* 99* 101 98*  CO2 33* 30 30 27   GLUCOSE 125* 103* 100* 121*  BUN 20 17 9 11   CREATININE 1.18 1.12 0.90 1.01  CALCIUM 10.6* 9.6 9.9 9.6  AST 22  --   --   --   ALT 16*  --   --   --   ALKPHOS 67  --   --   --   BILITOT 0.9  --   --   --    ------------------------------------------------------------------------------------------------------------------ estimated creatinine clearance is 46.6 mL/min (by C-G formula based on Cr of 1.01). ------------------------------------------------------------------------------------------------------------------ No results for input(s): HGBA1C in the last 72 hours. ------------------------------------------------------------------------------------------------------------------ No results for input(s): CHOL, HDL, LDLCALC, TRIG, CHOLHDL, LDLDIRECT in the last 72 hours. ------------------------------------------------------------------------------------------------------------------  Recent Labs  01/26/15 0533  TSH 1.667   ------------------------------------------------------------------------------------------------------------------  Recent Labs  01/26/15 0533  VITAMINB12 536    Coagulation profile  Recent Labs Lab 01/26/15 0045  INR 1.07    No results for input(s): DDIMER in the last 72 hours.  Cardiac Enzymes No results for input(s): CKMB, TROPONINI, MYOGLOBIN in the last  168 hours.  Invalid input(s): CK ------------------------------------------------------------------------------------------------------------------ Invalid input(s): POCBNP  No results for input(s): GLUCAP in the last 72 hours.   Azaylah Stailey M.D. [facility] 01/28/2015, 12:18 PM  Pager: [phone]   Between 7am to 7pm - call Pager - [phone]  After 7pm go to [email or web address] - password [email or web address]  Call night coverage person covering after 7pm

## 2015-01-28 NOTE — Consult Note (Signed)
Physical Medicine and Rehabilitation Consult Reason for Consult: Cervical myelopathy Referring Physician: [facility]   HPI: Brian Salinas is a 74 y.o. right handed male with history of nephrolithiasis, gout. Seen by neurology in [facility] recently due to weakness and stabbing pain and tingling in both legs 2 weeks. Independent prior to admission sedentary living with his son. Noted progressive decline in weight loss over the past month. Presented 01/26/2015 with increasing weakness in both legs. CT of the head showed no acute intracranial abnormalities. MRI cervical spine showed showed to be kyphotic noted disc degeneration, spondylosis and mild stenosis prominent at C3-4 C4-5. The patient's thoracic MRI demonstrated mild T9 compression fracture without significant neural compression. His lumbar MRI demonstrated diffuse degenerative changes. Neurosurgery consulted Dr. Lovell Sheehan and did not think his cervical spondylosis stenosis explained his stiffness and hypertonicity. Not felt to be a good surgical candidate and workup ongoing. Subcutaneous Lovenox for DVT prophylaxis. Tolerating a regular consistency diet. Physical therapy evaluation completed with recommendations of physical medicine rehabilitation consult.   Review of Systems  Constitutional: Positive for weight loss. Negative for fever and chills.  HENT: Negative for hearing loss.   Eyes: Negative for double vision.  Respiratory: Negative for cough and shortness of breath.   Cardiovascular: Negative for chest pain and palpitations.  Gastrointestinal: Positive for heartburn and constipation.  Genitourinary: Positive for urgency and frequency. Negative for hematuria.  Musculoskeletal: Positive for myalgias and joint pain.  Skin: Negative for rash.  Neurological: Positive for weakness. Negative for seizures and headaches.   Past Medical History  Diagnosis Date  . Bladder troubles   . Gout   . History of kidney stones   . BPH  (benign prostatic hyperplasia)    Past Surgical History  Procedure Laterality Date  . No past surgeries    . Colonoscopy    . Cystoscopy with retrograde pyelogram, ureteroscopy and stent placement Right 11/30/2014    Procedure: CYSTOSCOPY WITH RETROGRADE PYELOGRAM, URETEROSCOPY AND STENT PLACEMENT RIGHT SIDE;  Surgeon: Sebastian Ache, MD;  Location: [facility];  Service: Urology;  Laterality: Right;  . Holmium laser application Right 11/30/2014    Procedure: HOLMIUM LASER APPLICATION;  Surgeon: Sebastian Ache, MD;  Location: [facility];  Service: Urology;  Laterality: Right;   Family History  Problem Relation Age of Onset  . Cancer Father   . Prostate cancer Brother    Social History:  reports that he quit smoking about 17 years ago. His smoking use included Cigarettes. He quit after 20 years of use. He has never used smokeless tobacco. He reports that he does not drink alcohol or use illicit drugs. Allergies: No Known Allergies Medications Prior to Admission  Medication Sig Dispense Refill  . finasteride (PROSCAR) 5 MG tablet Take 5 mg by mouth daily.    Marland Kitchen senna-docusate (SENOKOT-S) 8.6-50 MG per tablet Take 1 tablet by mouth 2 (two) times daily. While taking pain meds to prevent constipation 30 tablet 0  . Tamsulosin HCl (FLOMAX) 0.4 MG CAPS Take 0.8 mg by mouth daily.    . cephALEXin (KEFLEX) 500 MG capsule Take 1 capsule (500 mg total) by mouth 2 (two) times daily. X 4 days to prevent post-op infection (Patient not taking: Reported on 01/25/2015) 8 capsule 0  . oxyCODONE-acetaminophen (PERCOCET/ROXICET) 5-325 MG per tablet Take 1-2 tablets by mouth every 6 (six) hours as needed for moderate pain or severe pain. Post-operatively (Patient not taking: Reported on 01/25/2015) 30 tablet 0    Home: Home Living  Family/patient expects to be discharged to:: Private residence Living Arrangements: Children Available Help at Discharge: Family Type of Home: House Home Access: Stairs to enter Water quality scientist of Steps: 6 Entrance Stairs-Rails: Right Home Layout: One level Home Equipment: Grab bars - tub/shower Additional Comments: pt lives with son, is alone when son at work. Spoke with pt's brother who reports that pt's health and function has been declining over past year but had kidney stone surgery a month ago and has significantly declined since that time  Functional History: Prior Function Level of Independence: Independent Comments: was independent at home until a week or so ago when he started having difficulty ambulating, was very slow, and has lost 10 lbs Functional Status:  Mobility: Bed Mobility Overal bed mobility: Needs Assistance Bed Mobility: Supine to Sit Supine to sit: Mod assist General bed mobility comments: pt needed min tactile cues to initiate supine to sit and had difficulty continuing task. Mod A for hips to EOB for feet to floor. Increased tension in adductors with knees pressed together once in sitting. Transfers Overall transfer level: Needs assistance Equipment used: 2 person hand held assist Transfers: Sit to/from Stand, Stand Pivot Transfers Sit to Stand: +2 safety/equipment, Min assist Stand pivot transfers: +2 safety/equipment, Min assist General transfer comment: bilateral HHA for stand pivot transfer, slight left lean with inital standing.  Ambulation/Gait Ambulation/Gait assistance: +2 safety/equipment, Min assist Ambulation Distance (Feet): 25 Feet Assistive device: 2 person hand held assist General Gait Details: bilateral HHA, pt with very narrow BOS, fwd flexed posture, vc's needed for continuing stepping, pt c/o mild LE pain when asked. Ambulated 6' with min A of one with pt pushing IV pole.  Gait Pattern/deviations: Trunk flexed, Narrow base of support, Shuffle Gait velocity: very slow Gait velocity interpretation: <1.8 ft/sec, indicative of risk for recurrent falls    ADL:    Cognition: Cognition Overall Cognitive Status:  Impaired/Different from baseline Orientation Level: Oriented to person Cognition Arousal/Alertness: Lethargic, Suspect due to medications Behavior During Therapy: Flat affect Overall Cognitive Status: Impaired/Different from baseline Area of Impairment: Orientation, Following commands, Memory, Problem solving Orientation Level: Disoriented to, Time Memory: Decreased short-term memory Following Commands: Follows one step commands consistently, Follows one step commands with increased time Problem Solving: Slow processing, Decreased initiation, Requires verbal cues, Requires tactile cues General Comments: pt received Ativan in ED so lethargic and difficult to assess cognition accurately. However, pt's brother reports that he has had dampened response and slowness at home off meds as well. Noted that pt often had tongue slightly out of mouth and minimal verbalization during eval, did not have dentures in though  Blood pressure 137/78, pulse 87, temperature 98 F (36.7 C), temperature source Oral, resp. rate 16, height  (1.626 m), weight 49.442 kg (109 lb), SpO2 97 %. Physical Exam  Constitutional: He is oriented to person, place, and time.  74 year old frail Caucasian male  HENT:  Head: Normocephalic.  Eyes: EOM are normal.  Neck: Normal range of motion. Neck supple. No thyromegaly present.  Cardiovascular: Normal rate and regular rhythm.   Respiratory: Effort normal and breath sounds normal. No respiratory distress.  GI: Soft. Bowel sounds are normal. He exhibits no distension.  Neurological: He is alert and oriented to person, place, and time.  Mood is flat but appropriate. He makes good eye contact with examiner. Fair awareness of deficits. Follows commands. Flat facies. Moves all 4's. UE's grossly 5/5. LE's 3+ hf, 4/5 ke and ankles. DTR's are 3+ throughout. Appears  to have rigidity, (?component of hypertonicity) in limbs---when asked to relax, a lot of tightness disappears. Decreased  LT from toes to mid thigh bilaterally. Fair insight and awareness. Speech slightly slurred.     Results for orders placed or performed during the hospital encounter of 01/25/15 (from the past 24 hour(s))  CBC     Status: None   Collection Time: 01/27/15  5:24 AM  Result Value Ref Range   WBC 8.4 4.0 - 10.5 K/uL   RBC 4.80 4.22 - 5.81 MIL/uL   Hemoglobin 14.4 13.0 - 17.0 g/dL   HCT 16.1 09.6 - 04.5 %   MCV 87.1 78.0 - 100.0 fL   MCH 30.0 26.0 - 34.0 pg   MCHC 34.4 30.0 - 36.0 g/dL   RDW 40.9 81.1 - 91.4 %   Platelets 187 150 - 400 K/uL  Basic metabolic panel     Status: Abnormal   Collection Time: 01/27/15  5:24 AM  Result Value Ref Range   Sodium 139 135 - 145 mmol/L   Potassium 3.7 3.5 - 5.1 mmol/L   Chloride 101 101 - 111 mmol/L   CO2 30 22 - 32 mmol/L   Glucose, Bld 100 (H) 65 - 99 mg/dL   BUN 9 6 - 20 mg/dL   Creatinine, Ser 7.82 0.61 - 1.24 mg/dL   Calcium 9.9 8.9 - 95.6 mg/dL   GFR calc non Af Amer >60 >60 mL/min   GFR calc Af Amer >60 >60 mL/min   Anion gap 8 5 - 15   Ct Chest W Contrast  01/26/2015   CLINICAL DATA:  Weight loss, failure to thrive  EXAM: CT CHEST, ABDOMEN, AND PELVIS WITH CONTRAST  TECHNIQUE: Multidetector CT imaging of the chest, abdomen and pelvis was performed following the standard protocol during bolus administration of intravenous contrast.  CONTRAST:  75mL OMNIPAQUE IOHEXOL 300 MG/ML  SOLN  COMPARISON:  CT abdomen/ pelvis 11/24/2014. No prior similar imaging of the chest. Spine MRI 01/25/2015.  FINDINGS: CT CHEST FINDINGS  Mediastinum/Nodes: Moderate atheromatous aortic calcification without aneurysm. No lymphadenopathy. Great vessels are normal in caliber. Heart size is normal. No pericardial effusion. Representative 5 mm pretracheal node is identified image 24. No axillary lymphadenopathy.  Lungs/Pleura: Left maxillary sinus mucous retention cyst or polyp partly visualized. Diffuse emphysematous changes are noted within lungs. The lungs are clear. No  pleural effusion.  Upper abdomen: Please see dedicated report below.  Musculoskeletal: No acute osseous abnormality.  CT ABDOMEN AND PELVIS FINDINGS  Lower chest:  Please see dedicated report above.  Hepatobiliary: 0.9 cm medial segment left hepatic lobe cyst. Liver is otherwise unremarkable. Gallbladder appears normal.  Pancreas: Normal  Spleen: Normal  Adrenals/Urinary Tract: Adrenal glands and kidneys appear normal. No radiopaque renal or ureteral calculus is identified. Depending calculi are noted within the bladder.  Stomach/Bowel: Mild stool burden. No bowel wall thickening or focal segmental dilatation is identified. The appendix is normal.  Vascular/Lymphatic: Atheromatous aortic calcification without aneurysm. No lymphadenopathy.  Musculoskeletal: Extensive lumbar spine disc degenerative change but no acute osseous abnormality. T9 compression deformity is unchanged since MRI 2 days ago.  Other: No free air or fluid.  IMPRESSION: No acute intra-abdominal scratch but no acute abnormality within the chest, abdomen, or pelvis or other finding to explain the history of weight loss and failure to thrive.   Electronically Signed   By: Christiana Pellant M.D.   On: 01/26/2015 19:11   Ct Abdomen Pelvis W Contrast  01/26/2015   CLINICAL DATA:  Weight loss, failure to thrive  EXAM: CT CHEST, ABDOMEN, AND PELVIS WITH CONTRAST  TECHNIQUE: Multidetector CT imaging of the chest, abdomen and pelvis was performed following the standard protocol during bolus administration of intravenous contrast.  CONTRAST:  75mL OMNIPAQUE IOHEXOL 300 MG/ML  SOLN  COMPARISON:  CT abdomen/ pelvis 11/24/2014. No prior similar imaging of the chest. Spine MRI 01/25/2015.  FINDINGS: CT CHEST FINDINGS  Mediastinum/Nodes: Moderate atheromatous aortic calcification without aneurysm. No lymphadenopathy. Great vessels are normal in caliber. Heart size is normal. No pericardial effusion. Representative 5 mm pretracheal node is identified image 24. No  axillary lymphadenopathy.  Lungs/Pleura: Left maxillary sinus mucous retention cyst or polyp partly visualized. Diffuse emphysematous changes are noted within lungs. The lungs are clear. No pleural effusion.  Upper abdomen: Please see dedicated report below.  Musculoskeletal: No acute osseous abnormality.  CT ABDOMEN AND PELVIS FINDINGS  Lower chest:  Please see dedicated report above.  Hepatobiliary: 0.9 cm medial segment left hepatic lobe cyst. Liver is otherwise unremarkable. Gallbladder appears normal.  Pancreas: Normal  Spleen: Normal  Adrenals/Urinary Tract: Adrenal glands and kidneys appear normal. No radiopaque renal or ureteral calculus is identified. Depending calculi are noted within the bladder.  Stomach/Bowel: Mild stool burden. No bowel wall thickening or focal segmental dilatation is identified. The appendix is normal.  Vascular/Lymphatic: Atheromatous aortic calcification without aneurysm. No lymphadenopathy.  Musculoskeletal: Extensive lumbar spine disc degenerative change but no acute osseous abnormality. T9 compression deformity is unchanged since MRI 2 days ago.  Other: No free air or fluid.  IMPRESSION: No acute intra-abdominal scratch but no acute abnormality within the chest, abdomen, or pelvis or other finding to explain the history of weight loss and failure to thrive.   Electronically Signed   By: Christiana Pellant M.D.   On: 01/26/2015 19:11    Assessment/Plan: Diagnosis: gait disorder, lower extremity weakness. Mild cervical myelopathy, peripheral neuropathy?  Also displays some rigidity and parkinsonian features 1. Does the need for close, 24 hr/day medical supervision in concert with the patient's rehab needs make it unreasonable for this patient to be served in a less intensive setting? Yes 2. Co-Morbidities requiring supervision/potential complications: FTT/malnutrition, depression 3. Due to bladder management, bowel management, safety, skin/wound care, disease management,  medication administration, pain management and patient education, does the patient require 24 hr/day rehab nursing? Yes 4. Does the patient require coordinated care of a physician, rehab nurse, PT (1-2 hrs/day, 5 days/week), OT (1-2 hrs/day, 5 days/week) and SLP (1-2 hrs/day, 5 days/week) to address physical and functional deficits in the context of the above medical diagnosis(es)? Yes Addressing deficits in the following areas: balance, endurance, locomotion, strength, transferring, bowel/bladder control, bathing, dressing, feeding, grooming, toileting, cognition and psychosocial support 5. Can the patient actively participate in an intensive therapy program of at least 3 hrs of therapy per day at least 5 days per week? Yes 6. The potential for patient to make measurable gains while on inpatient rehab is good 7. Anticipated functional outcomes upon discharge from inpatient rehab are modified independent and supervision  with PT, modified independent and supervision with OT, modified independent and supervision with SLP. 8. Estimated rehab length of stay to reach the above functional goals is: 8-12 days 9. Does the patient have adequate social supports and living environment to accommodate these discharge functional goals? Yes 10. Anticipated D/C setting: Home 11. Anticipated post D/C treatments: HH therapy and Outpatient therapy 12. Overall Rehab/Functional Prognosis: excellent  RECOMMENDATIONS: This patient's condition is appropriate for continued  rehabilitative care in the following setting: CIR Patient has agreed to participate in recommended program. Yes Note that insurance prior authorization may be required for reimbursement for recommended care.  Comment: Rehab Admissions Coordinator to follow up.  Thanks,  Ranelle Oyster, MD, [place] [facility]     01/28/2015

## 2015-01-29 DIAGNOSIS — R29898 Other symptoms and signs involving the musculoskeletal system: Secondary | ICD-10-CM | POA: Diagnosis not present

## 2015-01-29 DIAGNOSIS — R627 Adult failure to thrive: Secondary | ICD-10-CM | POA: Diagnosis not present

## 2015-01-29 DIAGNOSIS — N400 Enlarged prostate without lower urinary tract symptoms: Secondary | ICD-10-CM | POA: Diagnosis not present

## 2015-01-29 LAB — BASIC METABOLIC PANEL
ANION GAP: 7 (ref 5–15)
BUN: 20 mg/dL (ref 6–20)
CO2: 32 mmol/L (ref 22–32)
Calcium: 9.6 mg/dL (ref 8.9–10.3)
Chloride: 98 mmol/L — ABNORMAL LOW (ref 101–111)
Creatinine, Ser: 1 mg/dL (ref 0.61–1.24)
Glucose, Bld: 109 mg/dL — ABNORMAL HIGH (ref 65–99)
POTASSIUM: 3.7 mmol/L (ref 3.5–5.1)
Sodium: 137 mmol/L (ref 135–145)

## 2015-01-29 LAB — CBC
HEMATOCRIT: 41.7 % (ref 39.0–52.0)
Hemoglobin: 14.6 g/dL (ref 13.0–17.0)
MCH: 30.5 pg (ref 26.0–34.0)
MCHC: 35 g/dL (ref 30.0–36.0)
MCV: 87.1 fL (ref 78.0–100.0)
Platelets: 178 10*3/uL (ref 150–400)
RBC: 4.79 MIL/uL (ref 4.22–5.81)
RDW: 12.4 % (ref 11.5–15.5)
WBC: 8.7 10*3/uL (ref 4.0–10.5)

## 2015-01-29 LAB — CLOSTRIDIUM DIFFICILE BY PCR: Toxigenic C. Difficile by PCR: NEGATIVE

## 2015-01-29 MED ORDER — SENNOSIDES-DOCUSATE SODIUM 8.6-50 MG PO TABS
1.0000 | ORAL_TABLET | Freq: Every evening | ORAL | Status: DC | PRN
  Filled 2015-01-29: qty 1

## 2015-01-29 MED ORDER — ENOXAPARIN SODIUM 40 MG/0.4ML ~~LOC~~ SOLN
40.0000 mg | SUBCUTANEOUS | Status: DC
  Filled 2015-01-29: qty 0.4

## 2015-01-29 MED ORDER — PANTOPRAZOLE SODIUM 40 MG PO TBEC: 40.0000 mg | DELAYED_RELEASE_TABLET | Freq: Every day | ORAL | Status: AC

## 2015-01-29 MED ORDER — SENNOSIDES-DOCUSATE SODIUM 8.6-50 MG PO TABS: 1.0000 | ORAL_TABLET | Freq: Every evening | ORAL | Status: AC | PRN

## 2015-01-29 MED ORDER — ENSURE ENLIVE PO LIQD: 237.0000 mL | Freq: Two times a day (BID) | ORAL | Status: AC

## 2015-01-29 NOTE — Discharge Summary (Signed)
Physician Discharge Summary   Patient ID: Brian Salinas MRN: [id number] DOB/AGE: 72-May-1942 72 y.o.  Admit date: 01/25/2015 Discharge date: 01/29/2015  Primary Care Physician:  Sid Falcon, MD  Discharge Diagnoses:    . Stiffness, cervical spondylosis, stenosis, cervical myelopathy Weakness of both legs    Hypotension . Weakness of both legs . Adult failure to thrive . Hypercalcemia . Severe protein-calorie malnutrition . BPH (benign prostatic hyperplasia)  Consults:   Neurology Dr. Amada Jupiter Neurosurgery, Dr. Lovell Sheehan  Inpatient rehabilitation   Recommendations for Outpatient Follow-up:  Patient is recommended to follow-up in neurosurgery in 2 weeks, Dr. Lovell Sheehan   TESTS THAT NEED FOLLOW-UP None   DIET: Heart healthy diet    Allergies:  No Known Allergies   Discharge Medications:   Medication List    STOP taking these medications        cephALEXin 500 MG capsule  Commonly known as:  KEFLEX     oxyCODONE-acetaminophen 5-325 MG per tablet  Commonly known as:  PERCOCET/ROXICET      TAKE these medications        feeding supplement (ENSURE ENLIVE) Liqd  Take 237 mLs by mouth 2 (two) times daily between meals.     finasteride 5 MG tablet  Commonly known as:  PROSCAR  Take 5 mg by mouth daily.     pantoprazole 40 MG tablet  Commonly known as:  PROTONIX  Take 1 tablet (40 mg total) by mouth daily.     senna-docusate 8.6-50 MG per tablet  Commonly known as:  Senokot-S  Take 1 tablet by mouth at bedtime as needed for mild constipation. While taking pain meds to prevent constipation     tamsulosin 0.4 MG Caps capsule  Commonly known as:  FLOMAX  Take 0.8 mg by mouth daily.         Brief H and P: For complete details please refer to admission H and P, but in brief Brian Salinas is a 74 y.o. male with a history of Nephrolithiasis, BPH, Gout who was seen by Neurology in [facility] this AM due to weakness and stabbing pain and tingling in  both of his legs for 2 weeks. He was seen and referred to the ED for further evaluation since he had been unable to walk, and has had progressive decline and weight loss for the past month. He has had poor intake of foods and liquids and a reported loss of 20 pounds in 30 days. He was seen by Neurology Dr. Hosie Poisson in the ED and was sent for MRIs of the C-spine, T-Spine and LS Spine and referred for further evaluation.   Hospital Course:   Stiffness, cervical spondylosis, stenosis, cervical myelopathy Weakness of both legs - Unclear etiology likely due to degenerative disc disease, malnutrition - Neurology was consulted. Patient underwent MRI of the C-spine, through lumbar spine done, showed C-Spine Kink, Compression Fx of T-9, and DDD and Disk protrusions L5-S1 - TSH 1.67, B12 normal - Patient was seen by neurosurgery, Dr. Lovell Sheehan, per neurosurgery opinion, patient may have bit of a cervical myelopathy however small part of his clinical syndrome, no need of any neurosurgery at this time PT evaluation was done and recommended inpatient rehabilitation or skilled nursing facility. Continue physical therapy.   Adult failure to thrive, Severe protein-calorie malnutrition, left-sided abdominal pain on admission No further abdominal pains. Vision underwent CT abdomen and pelvis which showed no acute abdominal pathology. Swallow evaluation was done and patient was placed on regular diet. Due  to smoking history and recent loss of weight and anorexia, CT chest was obtained which showed no malignancy. Nutrition consult was obtained and patient was started on nutritional supplements.   Hypotension, Dehydration and UA positive for ketones: Due to poor by mouth intake, patient was restarted on IV fluids for gentle hydration.   Hypercalcemia improved with IV fluid hydration   BPH (benign prostatic hyperplasia) -Continue Proscar and Flomax   Day of Discharge BP 112/69 mmHg  Pulse 94  Temp(Src) 98.2  F (36.8 C) (Oral)  Resp 18  Ht 5\' 4"  (1.626 m)  Wt 49.079 kg (108 lb 3.2 oz)  BMI 18.56 kg/m2  SpO2 97%  Physical Exam: General: Alert and awake oriented x3 not in any acute distress. HEENT: anicteric sclera, pupils reactive to light and accommodation CVS: S1-S2 clear no murmur rubs or gallops Chest: clear to auscultation bilaterally, no wheezing rales or rhonchi Abdomen: soft nontender, nondistended, normal bowel sounds Extremities: no cyanosis, clubbing or edema noted bilaterally Neuro: Cranial nerves II-XII intact, no focal neurological deficits   The results of significant diagnostics from this hospitalization (including imaging, microbiology, ancillary and laboratory) are listed below for reference.    LAB RESULTS: Basic Metabolic Panel:  Recent Labs Lab 01/28/15 0506 01/29/15 0426  NA 135 137  K 3.8 3.7  CL 98* 98*  CO2 27 32  GLUCOSE 121* 109*  BUN 11 20  CREATININE 1.01 1.00  CALCIUM 9.6 9.6   Liver Function Tests:  Recent Labs Lab 01/25/15 1500  AST 22  ALT 16*  ALKPHOS 67  BILITOT 0.9  PROT 7.7  ALBUMIN 3.8    Recent Labs Lab 01/25/15 1838  LIPASE 23   No results for input(s): AMMONIA in the last 168 hours. CBC:  Recent Labs Lab 01/25/15 1500  01/28/15 0506 01/29/15 0426  WBC 10.4  < > 9.3 8.7  NEUTROABS 7.9*  --   --   --   HGB 15.4  < > 14.5 14.6  HCT 44.6  < > 42.2 41.7  MCV 87.3  < > 86.7 87.1  PLT 225  < > 190 178  < > = values in this interval not displayed. Cardiac Enzymes: No results for input(s): CKTOTAL, CKMB, CKMBINDEX, TROPONINI in the last 168 hours. BNP: Invalid input(s): POCBNP CBG: No results for input(s): GLUCAP in the last 168 hours.  Significant Diagnostic Studies:  Ct Head Wo Contrast  01/25/2015   CLINICAL DATA:  Bilateral lower extremity numbness for 2-3 weeks.  EXAM: CT HEAD WITHOUT CONTRAST  TECHNIQUE: Contiguous axial images were obtained from the base of the skull through the vertex without intravenous  contrast.  COMPARISON:  CT scan of August 05, 2012.  FINDINGS: Bony calvarium appears intact. Minimal diffuse cortical atrophy is noted. Mild chronic ischemic white matter disease is noted. No mass effect or midline shift is noted. Ventricular size is within normal limits. There is no evidence of mass lesion, hemorrhage or acute infarction.  IMPRESSION: Minimal diffuse cortical atrophy. Mild chronic ischemic white matter disease. No acute intracranial abnormality seen.   Electronically Signed   By: Lupita Raider, M.D.   On: 01/25/2015 19:53   Ct Chest W Contrast  01/26/2015   CLINICAL DATA:  Weight loss, failure to thrive  EXAM: CT CHEST, ABDOMEN, AND PELVIS WITH CONTRAST  TECHNIQUE: Multidetector CT imaging of the chest, abdomen and pelvis was performed following the standard protocol during bolus administration of intravenous contrast.  CONTRAST:  75mL OMNIPAQUE IOHEXOL 300 MG/ML  SOLN  COMPARISON:  CT abdomen/ pelvis 11/24/2014. No prior similar imaging of the chest. Spine MRI 01/25/2015.  FINDINGS: CT CHEST FINDINGS  Mediastinum/Nodes: Moderate atheromatous aortic calcification without aneurysm. No lymphadenopathy. Great vessels are normal in caliber. Heart size is normal. No pericardial effusion. Representative 5 mm pretracheal node is identified image 24. No axillary lymphadenopathy.  Lungs/Pleura: Left maxillary sinus mucous retention cyst or polyp partly visualized. Diffuse emphysematous changes are noted within lungs. The lungs are clear. No pleural effusion.  Upper abdomen: Please see dedicated report below.  Musculoskeletal: No acute osseous abnormality.  CT ABDOMEN AND PELVIS FINDINGS  Lower chest:  Please see dedicated report above.  Hepatobiliary: 0.9 cm medial segment left hepatic lobe cyst. Liver is otherwise unremarkable. Gallbladder appears normal.  Pancreas: Normal  Spleen: Normal  Adrenals/Urinary Tract: Adrenal glands and kidneys appear normal. No radiopaque renal or ureteral calculus is  identified. Depending calculi are noted within the bladder.  Stomach/Bowel: Mild stool burden. No bowel wall thickening or focal segmental dilatation is identified. The appendix is normal.  Vascular/Lymphatic: Atheromatous aortic calcification without aneurysm. No lymphadenopathy.  Musculoskeletal: Extensive lumbar spine disc degenerative change but no acute osseous abnormality. T9 compression deformity is unchanged since MRI 2 days ago.  Other: No free air or fluid.  IMPRESSION: No acute intra-abdominal scratch but no acute abnormality within the chest, abdomen, or pelvis or other finding to explain the history of weight loss and failure to thrive.   Electronically Signed   By: Christiana Pellant M.D.   On: 01/26/2015 19:11   Mr Brain Wo Contrast  01/25/2015   CLINICAL DATA:  Initial evaluation for progressive decline, back pain, difficulty walking  EXAM: MRI HEAD WITHOUT CONTRAST  MRI CERVICAL SPINE WITHOUT CONTRAST  MRI THORACIC SPINE WITHOUT CONTRAST  MRI LUMBAR SPINE WITHOUT CONTRAST  TECHNIQUE: Multiplanar, multiecho pulse sequences of the brain and spine and surrounding structures were obtained without intravenous contrast.  COMPARISON:  Prior CT from earlier the same day.  FINDINGS: MRI BRAIN:  Study is degraded by motion artifact.  Diffuse prominence of the CSF containing spaces is compatible with generalized atrophy. Patchy and confluent T2/FLAIR hyperintensity within the periventricular and deep white matter both cerebral hemispheres most consistent with chronic small vessel ischemic changes.  No acute infarct. No mass lesion, midline shift, or mass effect. No hydrocephalus. No extra-axial fluid collection. Normal intravascular flow voids maintained. No acute or chronic intracranial hemorrhage.  Orbits within normal limits. Paranasal sinuses are clear. No mastoid effusion. Inner ear structures normal.  Bone marrow signal intensity within normal limits.  MRI CERVICAL SPINE:  Craniocervical junction is  widely patent.  There is reversal of the normal cervical lordosis with apex at C3. There is 3 mm anterolisthesis of C2 on C3, with 3 mm retrolisthesis of C3 on C4. Trace anterolisthesis of C5 on C6 and C7 on T1.  Vertebral body heights are maintained. No fracture. Heterogeneous degenerative disc disease present about the C3-4 intervertebral disc space. Otherwise vertebral body bone marrow signal intensity is normal  Signal intensity within the cervical spinal cord is normal. Spinal cord is kinked at the level of C3-4.  Paraspinous soft tissues within normal limits. Normal intravascular flow voids present within the vertebral arteries.  C2-C3: Mild diffuse degenerative disc osteophyte with bilateral uncovertebral spurring. There is resultant moderate right with mild left foraminal stenosis. Posterior disc osteophyte partially effaces the ventral thecal sac and results in mild canal stenosis.  C3-C4: Diffuse degenerative disc osteophyte with degenerative endplate changes,  bilateral uncovertebral spurring, and facet arthrosis. There is resultant severe right with moderate to severe left foraminal stenosis. There is severe canal narrowing with flattening of the cervical spinal cord. Thecal sac measures 7.7 mm in AP diameter. No associated cord signal changes.  C4-C5: Diffuse degenerative disc osteophyte with bilateral uncovertebral spurring, greater on the right. Superimposed mild facet hypertrophy. There is severe right with moderate to severe left foraminal stenosis. Posterior disc osteophyte largely effaces the ventral thecal sac and results in moderate to severe canal stenosis. Mild flattening of the cervical spinal cord without cord signal changes.  C5-C6: Bilateral uncovertebral spurring with prominent right-sided facet arthrosis. There is resultant severe right foraminal narrowing. Mild to moderate left foraminal stenosis. Posterior disc osteophyte minimally effaces the ventral thecal sac with resultant mild  canal stenosis.  C6-C7: Bilateral uncovertebral hypertrophy with mild facet arthrosis. Resultant moderate bilateral foraminal stenosis. Posterior disc osteophyte results in mild canal narrowing.  C7-T1: Bilateral uncovertebral hypertrophy with facet arthrosis. There is left greater than right mild canal narrowing. Left paracentral disc osteophyte minimally indents the ventral thecal sac with resultant mild canal narrowing.  MRI THORACIC SPINE:  Vertebral bodies are normally aligned with preservation of the normal thoracic kyphosis. No listhesis.  Chronic compression deformity of the T9 vertebral body noted. Associated degenerative endplate Schmorl's node present at the superior endplate of T9. Otherwise, vertebral body heights are maintained. No acute fracture. No marrow edema. No focal osseous lesion.  Signal intensity within the thoracic spinal cord is normal.  Paraspinous soft tissues are within normal limits. Visualized lungs are grossly clear.  Tiny left paracentral disc protrusion at T5-6 without significant stenosis. Tiny central disc protrusion at T7-8 without stenosis. Minimal disc bulge at T8-9 without stenosis. Shallow right foraminal disc protrusion at T9-10 without stenosis or neural impingement. Mild disc bulge at T10-11 without significant stenosis.  MRI LUMBAR SPINE:  In correlation with the thoracic spine, there appears to be transitional lumbosacral anatomy with partial lumbarization of the S1 segment. Vertebral bodies are normally aligned with preservation of the normal lumbar lordosis. Vertebral body heights are maintained. No acute fracture. No bone marrow edema. No focal osseous lesion.  Conus medullaris terminates normally at the L1-2 level. Signal intensity within the visualized cord is normal.  Paraspinous soft tissues demonstrate no acute abnormality. Visualized intra-abdominal aorta within normal limits. T2 hyperintense cyst partially visualized within the left kidney.  L1-2: Degenerative  disc desiccation present. Superimposed small central disc extrusion indents the ventral thecal sac without significant canal stenosis. Foramina remain widely patent.  L2-3: Degenerative disc desiccation with intervertebral disc space narrowing and mild diffuse disc bulge. Anterior endplate osteophytic spurring. No significant canal stenosis. Foramina remain widely patent.  L3-4: Mild diffuse disc bulge with disc desiccation. No focal disc herniation. No significant facet arthropathy. No canal or foraminal stenosis.  L4-5: Diffuse degenerative disc bulge with disc desiccation. No definite focal disc herniation. Superimposed facet and ligamentous hypertrophy. There is resultant mild canal and right foraminal narrowing. Left neural foramen remains widely patent.  L5-S1: Degenerative disc desiccation with disc bulge. Superimposed shallow broad-based disc protrusion, slightly eccentric to the left. Superimposed facet and ligamentous hypertrophy. Protruding disc appears to contact the transiting left S1 nerve root as it courses through the lateral recess, and could potentially result in left lower extremity radiculopathy. There is mild canal and bilateral foraminal narrowing at this level.  IMPRESSION: MRI BRAIN:  1. No acute intracranial infarct or other process identified. 2. Generalized cerebral atrophy with mild  to moderate chronic small vessel ischemic changes.  MRI CERVICAL SPINE:  1. Reversal of the normal cervical lordosis with apex at C3, resulting in severe canal stenosis at C3-4 and C4-5 with kinking of the spinal cord at these levels. No associated cord signal changes. 2. Additional multilevel degenerative disc disease with resultant foraminal narrowing as detailed above.  MRI THORACIC SPINE:  1. No acute abnormality within the thoracic spine. 2. Chronic compression deformity of the T9 vertebral body without significant retropulsion. 3. Mild multilevel degenerative disc disease as detailed above. No  significant canal or foraminal stenosis.  MRI LUMBAR SPINE:  1. No acute abnormality within the lumbar spine. No significant canal stenosis. 2. Shallow posterior disc protrusion at L5-S1, closely approximating the transiting left S1 nerve root in the left lateral recess. This could potentially result in left lower extremity radicular symptoms. 3. Small central disc extrusion at L1-2 without significant stenosis. 4. Additional mild degenerative changes as above. 5. Transitional lumbosacral anatomy. Careful correlation with imaging and plain film radiography recommended prior to any potential future planned surgical intervention.   Electronically Signed   By: Rise Mu M.D.   On: 01/25/2015 23:40   Mr Cervical Spine Wo Contrast  01/25/2015   CLINICAL DATA:  Initial evaluation for progressive decline, back pain, difficulty walking  EXAM: MRI HEAD WITHOUT CONTRAST  MRI CERVICAL SPINE WITHOUT CONTRAST  MRI THORACIC SPINE WITHOUT CONTRAST  MRI LUMBAR SPINE WITHOUT CONTRAST  TECHNIQUE: Multiplanar, multiecho pulse sequences of the brain and spine and surrounding structures were obtained without intravenous contrast.  COMPARISON:  Prior CT from earlier the same day.  FINDINGS: MRI BRAIN:  Study is degraded by motion artifact.  Diffuse prominence of the CSF containing spaces is compatible with generalized atrophy. Patchy and confluent T2/FLAIR hyperintensity within the periventricular and deep white matter both cerebral hemispheres most consistent with chronic small vessel ischemic changes.  No acute infarct. No mass lesion, midline shift, or mass effect. No hydrocephalus. No extra-axial fluid collection. Normal intravascular flow voids maintained. No acute or chronic intracranial hemorrhage.  Orbits within normal limits. Paranasal sinuses are clear. No mastoid effusion. Inner ear structures normal.  Bone marrow signal intensity within normal limits.  MRI CERVICAL SPINE:  Craniocervical junction is widely  patent.  There is reversal of the normal cervical lordosis with apex at C3. There is 3 mm anterolisthesis of C2 on C3, with 3 mm retrolisthesis of C3 on C4. Trace anterolisthesis of C5 on C6 and C7 on T1.  Vertebral body heights are maintained. No fracture. Heterogeneous degenerative disc disease present about the C3-4 intervertebral disc space. Otherwise vertebral body bone marrow signal intensity is normal  Signal intensity within the cervical spinal cord is normal. Spinal cord is kinked at the level of C3-4.  Paraspinous soft tissues within normal limits. Normal intravascular flow voids present within the vertebral arteries.  C2-C3: Mild diffuse degenerative disc osteophyte with bilateral uncovertebral spurring. There is resultant moderate right with mild left foraminal stenosis. Posterior disc osteophyte partially effaces the ventral thecal sac and results in mild canal stenosis.  C3-C4: Diffuse degenerative disc osteophyte with degenerative endplate changes, bilateral uncovertebral spurring, and facet arthrosis. There is resultant severe right with moderate to severe left foraminal stenosis. There is severe canal narrowing with flattening of the cervical spinal cord. Thecal sac measures 7.7 mm in AP diameter. No associated cord signal changes.  C4-C5: Diffuse degenerative disc osteophyte with bilateral uncovertebral spurring, greater on the right. Superimposed mild facet hypertrophy. There is  severe right with moderate to severe left foraminal stenosis. Posterior disc osteophyte largely effaces the ventral thecal sac and results in moderate to severe canal stenosis. Mild flattening of the cervical spinal cord without cord signal changes.  C5-C6: Bilateral uncovertebral spurring with prominent right-sided facet arthrosis. There is resultant severe right foraminal narrowing. Mild to moderate left foraminal stenosis. Posterior disc osteophyte minimally effaces the ventral thecal sac with resultant mild canal  stenosis.  C6-C7: Bilateral uncovertebral hypertrophy with mild facet arthrosis. Resultant moderate bilateral foraminal stenosis. Posterior disc osteophyte results in mild canal narrowing.  C7-T1: Bilateral uncovertebral hypertrophy with facet arthrosis. There is left greater than right mild canal narrowing. Left paracentral disc osteophyte minimally indents the ventral thecal sac with resultant mild canal narrowing.  MRI THORACIC SPINE:  Vertebral bodies are normally aligned with preservation of the normal thoracic kyphosis. No listhesis.  Chronic compression deformity of the T9 vertebral body noted. Associated degenerative endplate Schmorl's node present at the superior endplate of T9. Otherwise, vertebral body heights are maintained. No acute fracture. No marrow edema. No focal osseous lesion.  Signal intensity within the thoracic spinal cord is normal.  Paraspinous soft tissues are within normal limits. Visualized lungs are grossly clear.  Tiny left paracentral disc protrusion at T5-6 without significant stenosis. Tiny central disc protrusion at T7-8 without stenosis. Minimal disc bulge at T8-9 without stenosis. Shallow right foraminal disc protrusion at T9-10 without stenosis or neural impingement. Mild disc bulge at T10-11 without significant stenosis.  MRI LUMBAR SPINE:  In correlation with the thoracic spine, there appears to be transitional lumbosacral anatomy with partial lumbarization of the S1 segment. Vertebral bodies are normally aligned with preservation of the normal lumbar lordosis. Vertebral body heights are maintained. No acute fracture. No bone marrow edema. No focal osseous lesion.  Conus medullaris terminates normally at the L1-2 level. Signal intensity within the visualized cord is normal.  Paraspinous soft tissues demonstrate no acute abnormality. Visualized intra-abdominal aorta within normal limits. T2 hyperintense cyst partially visualized within the left kidney.  L1-2: Degenerative disc  desiccation present. Superimposed small central disc extrusion indents the ventral thecal sac without significant canal stenosis. Foramina remain widely patent.  L2-3: Degenerative disc desiccation with intervertebral disc space narrowing and mild diffuse disc bulge. Anterior endplate osteophytic spurring. No significant canal stenosis. Foramina remain widely patent.  L3-4: Mild diffuse disc bulge with disc desiccation. No focal disc herniation. No significant facet arthropathy. No canal or foraminal stenosis.  L4-5: Diffuse degenerative disc bulge with disc desiccation. No definite focal disc herniation. Superimposed facet and ligamentous hypertrophy. There is resultant mild canal and right foraminal narrowing. Left neural foramen remains widely patent.  L5-S1: Degenerative disc desiccation with disc bulge. Superimposed shallow broad-based disc protrusion, slightly eccentric to the left. Superimposed facet and ligamentous hypertrophy. Protruding disc appears to contact the transiting left S1 nerve root as it courses through the lateral recess, and could potentially result in left lower extremity radiculopathy. There is mild canal and bilateral foraminal narrowing at this level.  IMPRESSION: MRI BRAIN:  1. No acute intracranial infarct or other process identified. 2. Generalized cerebral atrophy with mild to moderate chronic small vessel ischemic changes.  MRI CERVICAL SPINE:  1. Reversal of the normal cervical lordosis with apex at C3, resulting in severe canal stenosis at C3-4 and C4-5 with kinking of the spinal cord at these levels. No associated cord signal changes. 2. Additional multilevel degenerative disc disease with resultant foraminal narrowing as detailed above.  MRI THORACIC SPINE:  1. No acute abnormality within the thoracic spine. 2. Chronic compression deformity of the T9 vertebral body without significant retropulsion. 3. Mild multilevel degenerative disc disease as detailed above. No significant  canal or foraminal stenosis.  MRI LUMBAR SPINE:  1. No acute abnormality within the lumbar spine. No significant canal stenosis. 2. Shallow posterior disc protrusion at L5-S1, closely approximating the transiting left S1 nerve root in the left lateral recess. This could potentially result in left lower extremity radicular symptoms. 3. Small central disc extrusion at L1-2 without significant stenosis. 4. Additional mild degenerative changes as above. 5. Transitional lumbosacral anatomy. Careful correlation with imaging and plain film radiography recommended prior to any potential future planned surgical intervention.   Electronically Signed   By: Rise Mu M.D.   On: 01/25/2015 23:40   Mr Thoracic Spine Wo Contrast  01/25/2015   CLINICAL DATA:  Initial evaluation for progressive decline, back pain, difficulty walking  EXAM: MRI HEAD WITHOUT CONTRAST  MRI CERVICAL SPINE WITHOUT CONTRAST  MRI THORACIC SPINE WITHOUT CONTRAST  MRI LUMBAR SPINE WITHOUT CONTRAST  TECHNIQUE: Multiplanar, multiecho pulse sequences of the brain and spine and surrounding structures were obtained without intravenous contrast.  COMPARISON:  Prior CT from earlier the same day.  FINDINGS: MRI BRAIN:  Study is degraded by motion artifact.  Diffuse prominence of the CSF containing spaces is compatible with generalized atrophy. Patchy and confluent T2/FLAIR hyperintensity within the periventricular and deep white matter both cerebral hemispheres most consistent with chronic small vessel ischemic changes.  No acute infarct. No mass lesion, midline shift, or mass effect. No hydrocephalus. No extra-axial fluid collection. Normal intravascular flow voids maintained. No acute or chronic intracranial hemorrhage.  Orbits within normal limits. Paranasal sinuses are clear. No mastoid effusion. Inner ear structures normal.  Bone marrow signal intensity within normal limits.  MRI CERVICAL SPINE:  Craniocervical junction is widely patent.  There is  reversal of the normal cervical lordosis with apex at C3. There is 3 mm anterolisthesis of C2 on C3, with 3 mm retrolisthesis of C3 on C4. Trace anterolisthesis of C5 on C6 and C7 on T1.  Vertebral body heights are maintained. No fracture. Heterogeneous degenerative disc disease present about the C3-4 intervertebral disc space. Otherwise vertebral body bone marrow signal intensity is normal  Signal intensity within the cervical spinal cord is normal. Spinal cord is kinked at the level of C3-4.  Paraspinous soft tissues within normal limits. Normal intravascular flow voids present within the vertebral arteries.  C2-C3: Mild diffuse degenerative disc osteophyte with bilateral uncovertebral spurring. There is resultant moderate right with mild left foraminal stenosis. Posterior disc osteophyte partially effaces the ventral thecal sac and results in mild canal stenosis.  C3-C4: Diffuse degenerative disc osteophyte with degenerative endplate changes, bilateral uncovertebral spurring, and facet arthrosis. There is resultant severe right with moderate to severe left foraminal stenosis. There is severe canal narrowing with flattening of the cervical spinal cord. Thecal sac measures 7.7 mm in AP diameter. No associated cord signal changes.  C4-C5: Diffuse degenerative disc osteophyte with bilateral uncovertebral spurring, greater on the right. Superimposed mild facet hypertrophy. There is severe right with moderate to severe left foraminal stenosis. Posterior disc osteophyte largely effaces the ventral thecal sac and results in moderate to severe canal stenosis. Mild flattening of the cervical spinal cord without cord signal changes.  C5-C6: Bilateral uncovertebral spurring with prominent right-sided facet arthrosis. There is resultant severe right foraminal narrowing. Mild to moderate left foraminal stenosis. Posterior disc osteophyte minimally  effaces the ventral thecal sac with resultant mild canal stenosis.  C6-C7:  Bilateral uncovertebral hypertrophy with mild facet arthrosis. Resultant moderate bilateral foraminal stenosis. Posterior disc osteophyte results in mild canal narrowing.  C7-T1: Bilateral uncovertebral hypertrophy with facet arthrosis. There is left greater than right mild canal narrowing. Left paracentral disc osteophyte minimally indents the ventral thecal sac with resultant mild canal narrowing.  MRI THORACIC SPINE:  Vertebral bodies are normally aligned with preservation of the normal thoracic kyphosis. No listhesis.  Chronic compression deformity of the T9 vertebral body noted. Associated degenerative endplate Schmorl's node present at the superior endplate of T9. Otherwise, vertebral body heights are maintained. No acute fracture. No marrow edema. No focal osseous lesion.  Signal intensity within the thoracic spinal cord is normal.  Paraspinous soft tissues are within normal limits. Visualized lungs are grossly clear.  Tiny left paracentral disc protrusion at T5-6 without significant stenosis. Tiny central disc protrusion at T7-8 without stenosis. Minimal disc bulge at T8-9 without stenosis. Shallow right foraminal disc protrusion at T9-10 without stenosis or neural impingement. Mild disc bulge at T10-11 without significant stenosis.  MRI LUMBAR SPINE:  In correlation with the thoracic spine, there appears to be transitional lumbosacral anatomy with partial lumbarization of the S1 segment. Vertebral bodies are normally aligned with preservation of the normal lumbar lordosis. Vertebral body heights are maintained. No acute fracture. No bone marrow edema. No focal osseous lesion.  Conus medullaris terminates normally at the L1-2 level. Signal intensity within the visualized cord is normal.  Paraspinous soft tissues demonstrate no acute abnormality. Visualized intra-abdominal aorta within normal limits. T2 hyperintense cyst partially visualized within the left kidney.  L1-2: Degenerative disc desiccation  present. Superimposed small central disc extrusion indents the ventral thecal sac without significant canal stenosis. Foramina remain widely patent.  L2-3: Degenerative disc desiccation with intervertebral disc space narrowing and mild diffuse disc bulge. Anterior endplate osteophytic spurring. No significant canal stenosis. Foramina remain widely patent.  L3-4: Mild diffuse disc bulge with disc desiccation. No focal disc herniation. No significant facet arthropathy. No canal or foraminal stenosis.  L4-5: Diffuse degenerative disc bulge with disc desiccation. No definite focal disc herniation. Superimposed facet and ligamentous hypertrophy. There is resultant mild canal and right foraminal narrowing. Left neural foramen remains widely patent.  L5-S1: Degenerative disc desiccation with disc bulge. Superimposed shallow broad-based disc protrusion, slightly eccentric to the left. Superimposed facet and ligamentous hypertrophy. Protruding disc appears to contact the transiting left S1 nerve root as it courses through the lateral recess, and could potentially result in left lower extremity radiculopathy. There is mild canal and bilateral foraminal narrowing at this level.  IMPRESSION: MRI BRAIN:  1. No acute intracranial infarct or other process identified. 2. Generalized cerebral atrophy with mild to moderate chronic small vessel ischemic changes.  MRI CERVICAL SPINE:  1. Reversal of the normal cervical lordosis with apex at C3, resulting in severe canal stenosis at C3-4 and C4-5 with kinking of the spinal cord at these levels. No associated cord signal changes. 2. Additional multilevel degenerative disc disease with resultant foraminal narrowing as detailed above.  MRI THORACIC SPINE:  1. No acute abnormality within the thoracic spine. 2. Chronic compression deformity of the T9 vertebral body without significant retropulsion. 3. Mild multilevel degenerative disc disease as detailed above. No significant canal or  foraminal stenosis.  MRI LUMBAR SPINE:  1. No acute abnormality within the lumbar spine. No significant canal stenosis. 2. Shallow posterior disc protrusion at L5-S1, closely approximating the  transiting left S1 nerve root in the left lateral recess. This could potentially result in left lower extremity radicular symptoms. 3. Small central disc extrusion at L1-2 without significant stenosis. 4. Additional mild degenerative changes as above. 5. Transitional lumbosacral anatomy. Careful correlation with imaging and plain film radiography recommended prior to any potential future planned surgical intervention.   Electronically Signed   By: Rise Mu M.D.   On: 01/25/2015 23:40   Mr Lumbar Spine Wo Contrast  01/25/2015   CLINICAL DATA:  Initial evaluation for progressive decline, back pain, difficulty walking  EXAM: MRI HEAD WITHOUT CONTRAST  MRI CERVICAL SPINE WITHOUT CONTRAST  MRI THORACIC SPINE WITHOUT CONTRAST  MRI LUMBAR SPINE WITHOUT CONTRAST  TECHNIQUE: Multiplanar, multiecho pulse sequences of the brain and spine and surrounding structures were obtained without intravenous contrast.  COMPARISON:  Prior CT from earlier the same day.  FINDINGS: MRI BRAIN:  Study is degraded by motion artifact.  Diffuse prominence of the CSF containing spaces is compatible with generalized atrophy. Patchy and confluent T2/FLAIR hyperintensity within the periventricular and deep white matter both cerebral hemispheres most consistent with chronic small vessel ischemic changes.  No acute infarct. No mass lesion, midline shift, or mass effect. No hydrocephalus. No extra-axial fluid collection. Normal intravascular flow voids maintained. No acute or chronic intracranial hemorrhage.  Orbits within normal limits. Paranasal sinuses are clear. No mastoid effusion. Inner ear structures normal.  Bone marrow signal intensity within normal limits.  MRI CERVICAL SPINE:  Craniocervical junction is widely patent.  There is reversal  of the normal cervical lordosis with apex at C3. There is 3 mm anterolisthesis of C2 on C3, with 3 mm retrolisthesis of C3 on C4. Trace anterolisthesis of C5 on C6 and C7 on T1.  Vertebral body heights are maintained. No fracture. Heterogeneous degenerative disc disease present about the C3-4 intervertebral disc space. Otherwise vertebral body bone marrow signal intensity is normal  Signal intensity within the cervical spinal cord is normal. Spinal cord is kinked at the level of C3-4.  Paraspinous soft tissues within normal limits. Normal intravascular flow voids present within the vertebral arteries.  C2-C3: Mild diffuse degenerative disc osteophyte with bilateral uncovertebral spurring. There is resultant moderate right with mild left foraminal stenosis. Posterior disc osteophyte partially effaces the ventral thecal sac and results in mild canal stenosis.  C3-C4: Diffuse degenerative disc osteophyte with degenerative endplate changes, bilateral uncovertebral spurring, and facet arthrosis. There is resultant severe right with moderate to severe left foraminal stenosis. There is severe canal narrowing with flattening of the cervical spinal cord. Thecal sac measures 7.7 mm in AP diameter. No associated cord signal changes.  C4-C5: Diffuse degenerative disc osteophyte with bilateral uncovertebral spurring, greater on the right. Superimposed mild facet hypertrophy. There is severe right with moderate to severe left foraminal stenosis. Posterior disc osteophyte largely effaces the ventral thecal sac and results in moderate to severe canal stenosis. Mild flattening of the cervical spinal cord without cord signal changes.  C5-C6: Bilateral uncovertebral spurring with prominent right-sided facet arthrosis. There is resultant severe right foraminal narrowing. Mild to moderate left foraminal stenosis. Posterior disc osteophyte minimally effaces the ventral thecal sac with resultant mild canal stenosis.  C6-C7: Bilateral  uncovertebral hypertrophy with mild facet arthrosis. Resultant moderate bilateral foraminal stenosis. Posterior disc osteophyte results in mild canal narrowing.  C7-T1: Bilateral uncovertebral hypertrophy with facet arthrosis. There is left greater than right mild canal narrowing. Left paracentral disc osteophyte minimally indents the ventral thecal sac with resultant mild  canal narrowing.  MRI THORACIC SPINE:  Vertebral bodies are normally aligned with preservation of the normal thoracic kyphosis. No listhesis.  Chronic compression deformity of the T9 vertebral body noted. Associated degenerative endplate Schmorl's node present at the superior endplate of T9. Otherwise, vertebral body heights are maintained. No acute fracture. No marrow edema. No focal osseous lesion.  Signal intensity within the thoracic spinal cord is normal.  Paraspinous soft tissues are within normal limits. Visualized lungs are grossly clear.  Tiny left paracentral disc protrusion at T5-6 without significant stenosis. Tiny central disc protrusion at T7-8 without stenosis. Minimal disc bulge at T8-9 without stenosis. Shallow right foraminal disc protrusion at T9-10 without stenosis or neural impingement. Mild disc bulge at T10-11 without significant stenosis.  MRI LUMBAR SPINE:  In correlation with the thoracic spine, there appears to be transitional lumbosacral anatomy with partial lumbarization of the S1 segment. Vertebral bodies are normally aligned with preservation of the normal lumbar lordosis. Vertebral body heights are maintained. No acute fracture. No bone marrow edema. No focal osseous lesion.  Conus medullaris terminates normally at the L1-2 level. Signal intensity within the visualized cord is normal.  Paraspinous soft tissues demonstrate no acute abnormality. Visualized intra-abdominal aorta within normal limits. T2 hyperintense cyst partially visualized within the left kidney.  L1-2: Degenerative disc desiccation present.  Superimposed small central disc extrusion indents the ventral thecal sac without significant canal stenosis. Foramina remain widely patent.  L2-3: Degenerative disc desiccation with intervertebral disc space narrowing and mild diffuse disc bulge. Anterior endplate osteophytic spurring. No significant canal stenosis. Foramina remain widely patent.  L3-4: Mild diffuse disc bulge with disc desiccation. No focal disc herniation. No significant facet arthropathy. No canal or foraminal stenosis.  L4-5: Diffuse degenerative disc bulge with disc desiccation. No definite focal disc herniation. Superimposed facet and ligamentous hypertrophy. There is resultant mild canal and right foraminal narrowing. Left neural foramen remains widely patent.  L5-S1: Degenerative disc desiccation with disc bulge. Superimposed shallow broad-based disc protrusion, slightly eccentric to the left. Superimposed facet and ligamentous hypertrophy. Protruding disc appears to contact the transiting left S1 nerve root as it courses through the lateral recess, and could potentially result in left lower extremity radiculopathy. There is mild canal and bilateral foraminal narrowing at this level.  IMPRESSION: MRI BRAIN:  1. No acute intracranial infarct or other process identified. 2. Generalized cerebral atrophy with mild to moderate chronic small vessel ischemic changes.  MRI CERVICAL SPINE:  1. Reversal of the normal cervical lordosis with apex at C3, resulting in severe canal stenosis at C3-4 and C4-5 with kinking of the spinal cord at these levels. No associated cord signal changes. 2. Additional multilevel degenerative disc disease with resultant foraminal narrowing as detailed above.  MRI THORACIC SPINE:  1. No acute abnormality within the thoracic spine. 2. Chronic compression deformity of the T9 vertebral body without significant retropulsion. 3. Mild multilevel degenerative disc disease as detailed above. No significant canal or foraminal  stenosis.  MRI LUMBAR SPINE:  1. No acute abnormality within the lumbar spine. No significant canal stenosis. 2. Shallow posterior disc protrusion at L5-S1, closely approximating the transiting left S1 nerve root in the left lateral recess. This could potentially result in left lower extremity radicular symptoms. 3. Small central disc extrusion at L1-2 without significant stenosis. 4. Additional mild degenerative changes as above. 5. Transitional lumbosacral anatomy. Careful correlation with imaging and plain film radiography recommended prior to any potential future planned surgical intervention.   Electronically Signed  By: Rise Mu M.D.   On: 01/25/2015 23:40   Ct Abdomen Pelvis W Contrast  01/26/2015   CLINICAL DATA:  Weight loss, failure to thrive  EXAM: CT CHEST, ABDOMEN, AND PELVIS WITH CONTRAST  TECHNIQUE: Multidetector CT imaging of the chest, abdomen and pelvis was performed following the standard protocol during bolus administration of intravenous contrast.  CONTRAST:  77mL OMNIPAQUE IOHEXOL 300 MG/ML  SOLN  COMPARISON:  CT abdomen/ pelvis 11/24/2014. No prior similar imaging of the chest. Spine MRI 01/25/2015.  FINDINGS: CT CHEST FINDINGS  Mediastinum/Nodes: Moderate atheromatous aortic calcification without aneurysm. No lymphadenopathy. Great vessels are normal in caliber. Heart size is normal. No pericardial effusion. Representative 5 mm pretracheal node is identified image 24. No axillary lymphadenopathy.  Lungs/Pleura: Left maxillary sinus mucous retention cyst or polyp partly visualized. Diffuse emphysematous changes are noted within lungs. The lungs are clear. No pleural effusion.  Upper abdomen: Please see dedicated report below.  Musculoskeletal: No acute osseous abnormality.  CT ABDOMEN AND PELVIS FINDINGS  Lower chest:  Please see dedicated report above.  Hepatobiliary: 0.9 cm medial segment left hepatic lobe cyst. Liver is otherwise unremarkable. Gallbladder appears normal.   Pancreas: Normal  Spleen: Normal  Adrenals/Urinary Tract: Adrenal glands and kidneys appear normal. No radiopaque renal or ureteral calculus is identified. Depending calculi are noted within the bladder.  Stomach/Bowel: Mild stool burden. No bowel wall thickening or focal segmental dilatation is identified. The appendix is normal.  Vascular/Lymphatic: Atheromatous aortic calcification without aneurysm. No lymphadenopathy.  Musculoskeletal: Extensive lumbar spine disc degenerative change but no acute osseous abnormality. T9 compression deformity is unchanged since MRI 2 days ago.  Other: No free air or fluid.  IMPRESSION: No acute intra-abdominal scratch but no acute abnormality within the chest, abdomen, or pelvis or other finding to explain the history of weight loss and failure to thrive.   Electronically Signed   By: Christiana Pellant M.D.   On: 01/26/2015 19:11    2D ECHO:   Disposition and Follow-up: Discharge Instructions    Diet - low sodium heart healthy    Complete by:  As directed      Increase activity slowly    Complete by:  As directed             DISPOSITION: Skilled nursing facility   DISCHARGE FOLLOW-UP     Follow-up Information    Follow up with Sid Falcon, MD. Schedule an appointment as soon as possible for a visit in 2 weeks.   Specialty:  Family Medicine   Why:  for hospital follow-up   Contact information:   [address] [place] [address] [phone]       Follow up with Cristi Loron, MD. Schedule an appointment as soon as possible for a visit in 2 weeks.   Specialty:  Neurosurgery   Why:  for hospital follow-up   Contact information:   [address] [place] [address] [phone]        Time spent on Discharge: 35 mins   Signed:   RAI,RIPUDEEP M.D. [facility] 01/29/2015, 1:04 PM Pager: [phone]

## 2015-01-29 NOTE — Progress Notes (Signed)
I met with Brian Salinas at bedside. Brian Salinas does not have supervision at home when his son, [name], works. He is not a candidate for inpt rehab admission for after 8-12 days we feel Brian Salinas will initially need 24/7 supervision at home. We recommend SNF rehab at this time. I contacted son, [name], who referred me to contact his sister, [name]. I have left her a message to call me as well as son, [name]. I have discussed with SW and RN [facility]. [phone]

## 2015-01-29 NOTE — Clinical Social Work Placement (Signed)
   CLINICAL SOCIAL WORK PLACEMENT  NOTE  Date:  01/29/2015  Patient Details  Name: Brian Salinas MRN: [id number] Date of Birth: [birth year]  Clinical Social Work is seeking post-discharge placement for this patient at the [place] level of care (*CSW will initial, date and re-position this form in  chart as items are completed):  Yes   Patient/family provided with [facility] Work Department's list of facilities offering this level of care within the geographic area requested by the patient (or if unable, by the patient's family).  Yes   Patient/family informed of their freedom to choose among providers that offer the needed level of care, that participate in Medicare, Medicaid or managed care program needed by the patient, have an available bed and are willing to accept the patient.  Yes   Patient/family informed of [facility]'s ownership interest in [facility] and [facility], as well as of the fact that they are under no obligation to receive care at these facilities.  PASRR submitted to EDS on       PASRR number received on       Existing PASRR number confirmed on 01/27/15     FL2 transmitted to all facilities in geographic area requested by pt/family on 01/27/15     FL2 transmitted to all facilities within larger geographic area on       Patient informed that his/her managed care company has contracts with or will negotiate with certain facilities, including the following:        Yes   Patient/family informed of bed offers received.  Patient chooses bed at [facility]     Physician recommends and patient chooses bed at      Patient to be transferred to [facility] on 01/29/15.  Patient to be transferred to facility by [facility] with brother     Patient family notified on 01/29/15 of transfer.  Name of family member notified:  [name]-  Daughter     PHYSICIAN Please prepare priority discharge  summary, including medications, Please sign FL2, Please prepare prescriptions     Additional Comment:  CSW met with patient this morning after speaking with [name], RN- [facility] who indicated that they would not be able to offer a bed to patient at [facility].  Patient once again deferred to his daughter [name] or brother [name]. CSW spoke with [name] re: above via telephone and bed offers given. Answers were provided re: why her  Father could not go to [facility] and she verbalized understanding. She will also follow up with [name].  Daughter has chosen a bed at [facility] and will go to facility this afternoon around 2 pm to sign admission paperwork.  Patient's brother will transport him via car. Nursing will be asked to call report to the facility once d/c summary is completed by Dr. Tana Coast who is now aware of change from [facility] to SNF placement.  No further CSW needs identified. CSW signing off.  [name]. Murrell Redden [phone]     _______________________________________________ Williemae Area, LCSW 01/29/2015, 12:59 PM

## 2015-01-29 NOTE — Progress Notes (Signed)
Pt has orders to be discharged. Discharge instructions given and pt has no additional questions at this time. Medication regimen reviewed and pt educated. Pt verbalized understanding and has no additional questions. IV removed and site in good condition. Pt stable and waiting for transportation. Report called to [facility] RN.  Jilda Panda RN

## 2015-01-30 NOTE — Progress Notes (Addendum)
OT Note - Addendum    02/12/15 0805  OT Visit Information  Last OT Received On 2015-02-12  OT G-codes **NOT FOR INPATIENT CLASS**  Functional Assessment Tool Used clinical judgement  Functional Limitation Self care  Self Care Current Status (I2979) CK  Self Care Goal Status (G9211) CI  [facility], OTR/L  [phone] 02/12/2015

## 2015-02-19 NOTE — Progress Notes (Signed)
Late entry for missed G-code. Based on review of the evaluation and goals by [facility].    01/26/15 1130  PT G-Codes **NOT FOR INPATIENT CLASS**  Functional Assessment Tool Used Clinical judgment based on review of medical record  Functional Limitation Mobility: Walking and moving around  Mobility: Walking and Moving Around Current Status (Z6109(G8978) CJ  Mobility: Walking and Moving Around Goal Status [phone](G8979) CI  Lavona MoundMark Danalee Flath, [place]PT  [phone] 02/19/2015

## 2016-07-08 IMAGING — CT CT CHEST W/ CM
2 of 5 series · 13 of 36 positions shown, 16 images · IV contrast (APPLIED)
Comparison: CT abdomen/ pelvis 11/24/2014. No prior similar imaging
of the chest. Spine MRI 01/25/2015.

CLINICAL DATA: Weight loss, failure to thrive

EXAM:
CT CHEST, ABDOMEN, AND PELVIS WITH CONTRAST
TECHNIQUE: Multidetector CT imaging of the chest, abdomen and pelvis was
performed following the standard protocol during bolus
administration of intravenous contrast.
CONTRAST:  75mL OMNIPAQUE IOHEXOL 300 MG/ML  SOLN

[Series 2: cap 5.0 i31f 1 · axial · 0.71mm/px · z∈[+891,+1371]mm · 10 of 112 slices shown, 13 images]
[im 8/112  mediastinal]
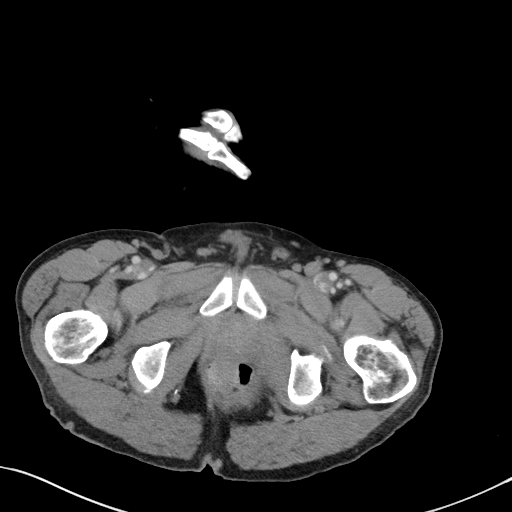
[im 8/112  lung]
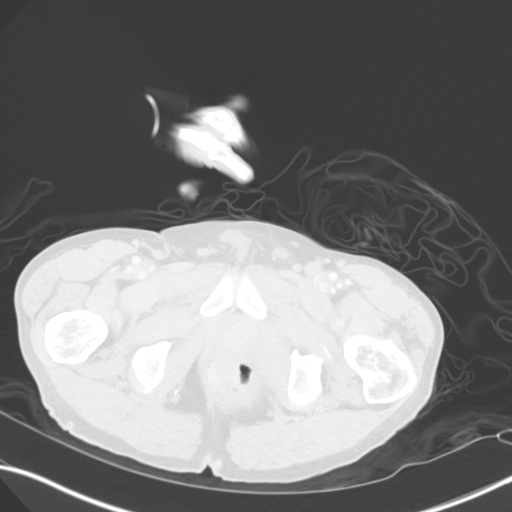
[im 23/112  lung]
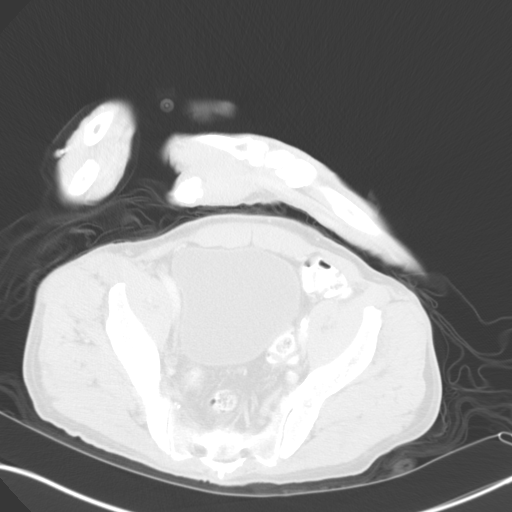
[im 30/112  lung]
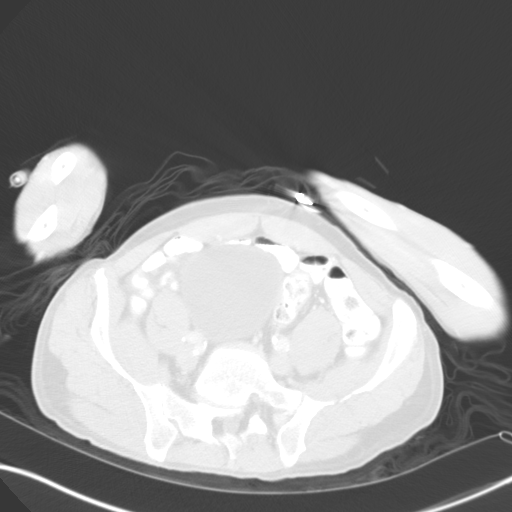
[im 38/112  lung]
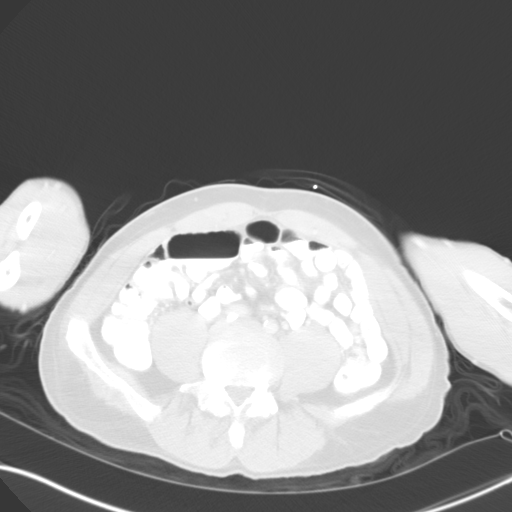
[im 52/112  mediastinal]
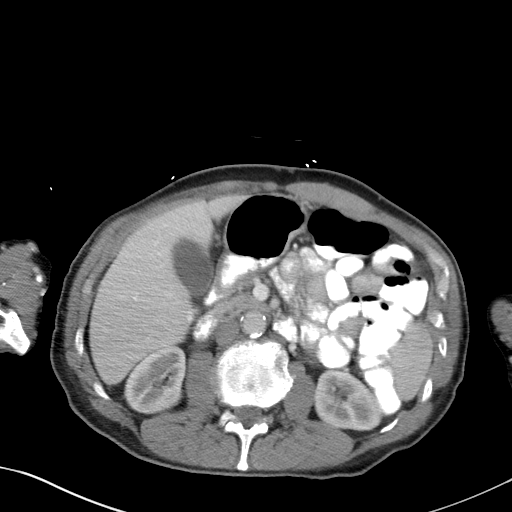
[im 52/112  lung]
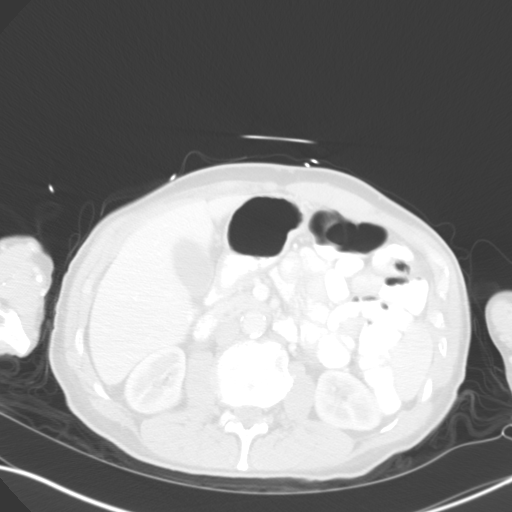
[im 60/112  lung]
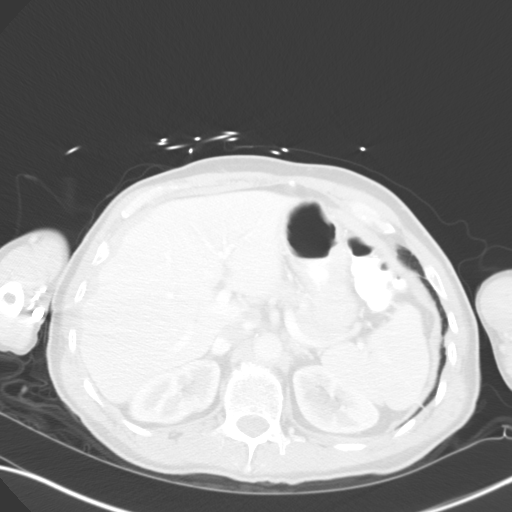
[im 75/112  lung]
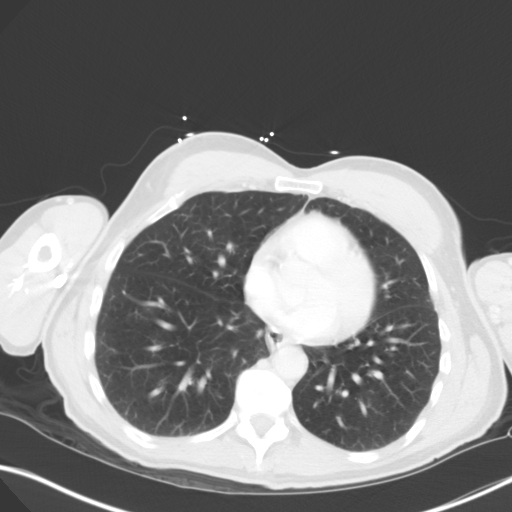
[im 82/112  lung]
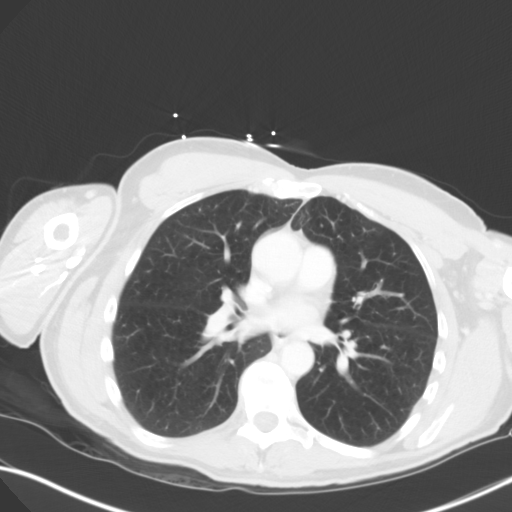
[im 89/112  mediastinal]
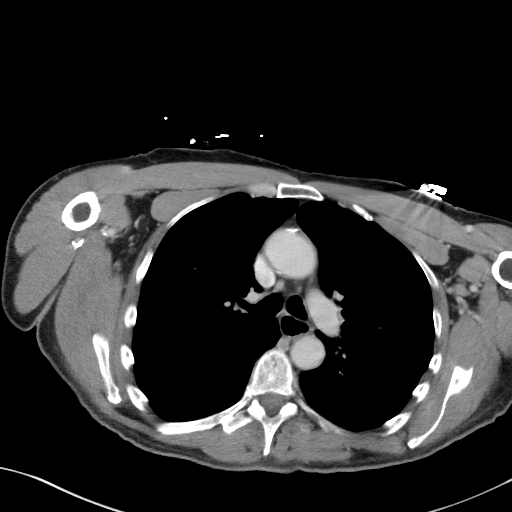
[im 89/112  lung]
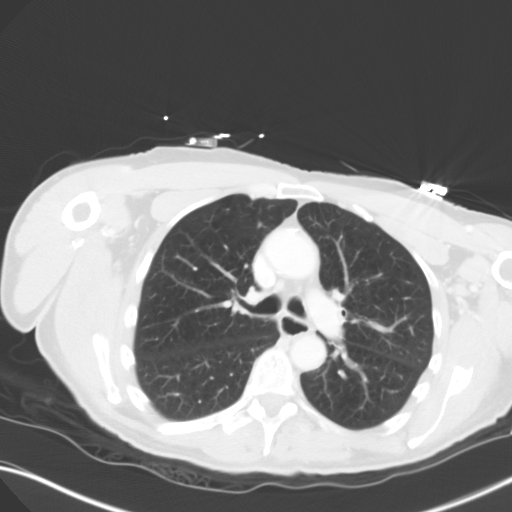
[im 104/112  lung]
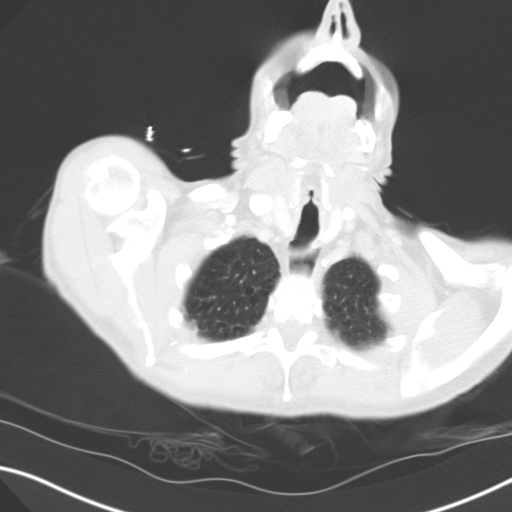

[Series 5: coronal · coronal · 0.63mm/px · 3 of 70 slices shown]
[im 14/70  lung]
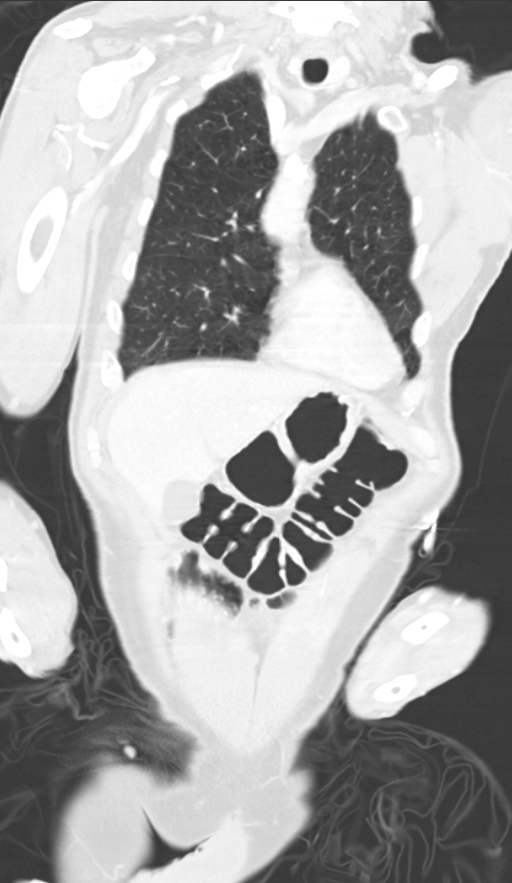
[im 28/70  lung]
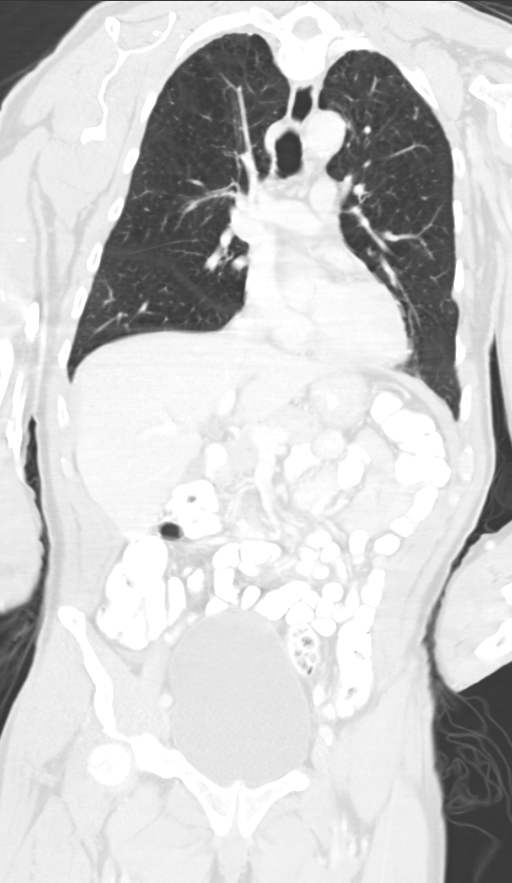
[im 42/70  lung]
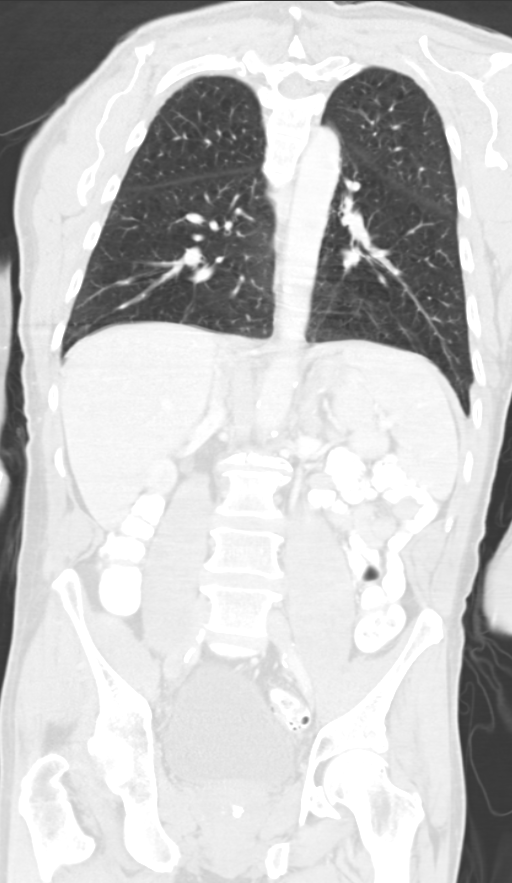

[13 of 36 positions shown; findings below may reference images not displayed]

FINDINGS: CT CHEST FINDINGS

Mediastinum/Nodes: Moderate atheromatous aortic calcification
without aneurysm. No lymphadenopathy. Great vessels are normal in
caliber. Heart size is normal. No pericardial effusion.
Representative 5 mm pretracheal node is identified image 24. No
axillary lymphadenopathy.

Lungs/Pleura: Left maxillary sinus mucous retention cyst or polyp
partly visualized. Diffuse emphysematous changes are noted within
lungs. The lungs are clear. No pleural effusion.

Upper abdomen: Please see dedicated report below.

Musculoskeletal: No acute osseous abnormality.

CT ABDOMEN AND PELVIS FINDINGS

Lower chest:  Please see dedicated report above.

Hepatobiliary: 0.9 cm medial segment left hepatic lobe cyst. Liver
is otherwise unremarkable. Gallbladder appears normal.

Pancreas: Normal

Spleen: Normal

Adrenals/Urinary Tract: Adrenal glands and kidneys appear normal. No
radiopaque renal or ureteral calculus is identified. Depending
calculi are noted within the bladder.

Stomach/Bowel: Mild stool burden. No bowel wall thickening or focal
segmental dilatation is identified. The appendix is normal.

Vascular/Lymphatic: Atheromatous aortic calcification without
aneurysm. No lymphadenopathy.

Musculoskeletal: Extensive lumbar spine disc degenerative change but
no acute osseous abnormality. T9 compression deformity is unchanged
since MRI 2 days ago.

Other: No free air or fluid.
IMPRESSION: No acute intra-abdominal scratch but no acute abnormality within the
chest, abdomen, or pelvis or other finding to explain the history of
weight loss and failure to thrive.

## 2021-08-18 ENCOUNTER — Emergency Department (HOSPITAL_COMMUNITY): Payer: Medicare Other

## 2021-08-18 ENCOUNTER — Encounter (HOSPITAL_COMMUNITY): Payer: Self-pay

## 2021-08-18 ENCOUNTER — Emergency Department (HOSPITAL_COMMUNITY)
Admission: EM | Admit: 2021-08-18 | Discharge: 2021-08-18 | Disposition: A | Discharge status: Home / Self Care | Payer: Medicare Other | Attending: Emergency Medicine | Admitting: Emergency Medicine

## 2021-08-18 ENCOUNTER — Other Ambulatory Visit: Payer: Self-pay

## 2021-08-18 DIAGNOSIS — S6991XA Unspecified injury of right wrist, hand and finger(s), initial encounter: Secondary | ICD-10-CM | POA: Diagnosis present

## 2021-08-18 DIAGNOSIS — F0390 Unspecified dementia without behavioral disturbance: Secondary | ICD-10-CM | POA: Insufficient documentation

## 2021-08-18 DIAGNOSIS — Y92129 Unspecified place in nursing home as the place of occurrence of the external cause: Secondary | ICD-10-CM | POA: Diagnosis not present

## 2021-08-18 DIAGNOSIS — M79641 Pain in right hand: Secondary | ICD-10-CM | POA: Insufficient documentation

## 2021-08-18 DIAGNOSIS — X58XXXA Exposure to other specified factors, initial encounter: Secondary | ICD-10-CM | POA: Insufficient documentation

## 2021-08-18 DIAGNOSIS — S61411A Laceration without foreign body of right hand, initial encounter: Secondary | ICD-10-CM | POA: Diagnosis not present

## 2021-08-18 DIAGNOSIS — Z20822 Contact with and (suspected) exposure to covid-19: Secondary | ICD-10-CM | POA: Diagnosis not present

## 2021-08-18 DIAGNOSIS — J40 Bronchitis, not specified as acute or chronic: Secondary | ICD-10-CM | POA: Diagnosis not present

## 2021-08-18 LAB — RESP PANEL BY RT-PCR (FLU A&B, COVID) ARPGX2
Influenza A by PCR: NEGATIVE
Influenza B by PCR: NEGATIVE
SARS Coronavirus 2 by RT PCR: NEGATIVE

## 2021-08-18 MED ORDER — GUAIFENESIN ER 600 MG PO TB12: 600.0000 mg | ORAL_TABLET | Freq: Two times a day (BID) | ORAL | 0 refills | Status: AC | PRN

## 2021-08-18 NOTE — ED Triage Notes (Signed)
Patient BIB [facility] from [facility] nursing home at [facility]. Nurses at facility said he wont keep his oxygen on, then gets combative. Patient dug his nails into his right wrist and the back of his hand. Patient is normally on 2L [place]. Patient has Parkinsons and Lewey Body Dementia.

## 2021-08-18 NOTE — ED Provider Notes (Addendum)
[facility]-EMERGENCY DEPT Provider Note   CSN: [id number] Arrival date & time: 08/18/21  0631     History  Chief Complaint  Patient presents with   Low Oxygen   Hand Injury    Brian Salinas is a 81 y.o. male.  HPI  81 year old male with past medical history dementia who is on 2 L of nasal cannula chronically presents to the emergency department with concern of noncompliance with oxygen.  Report from facility is that the patient will at times remove his oxygen and get combative. His mental status is baseline for him. No staff or family at bedside. When he pulls off his oxygen he reportedly desaturates to the low 90s high 80s. No reported fever, respiratory distress. He also scratched his right hand with the nails of his left hand.   Home Medications Prior to Admission medications   Medication Sig Start Date End Date Taking? Authorizing Provider  guaiFENesin (MUCINEX) 600 MG 12 hr tablet Take 1 tablet (600 mg total) by mouth 2 (two) times daily as needed. 08/18/21  Yes Adrianne Shackleton, Clabe SealKristie M, DO  feeding supplement, ENSURE ENLIVE, (ENSURE ENLIVE) LIQD Take 237 mLs by mouth 2 (two) times daily between meals. 01/29/15   Rai, Delene Ruffiniipudeep K, MD  finasteride (PROSCAR) 5 MG tablet Take 5 mg by mouth daily.    [provider]  pantoprazole (PROTONIX) 40 MG tablet Take 1 tablet (40 mg total) by mouth daily. 01/29/15   Rai, Ripudeep K, MD  senna-docusate (SENOKOT-S) 8.6-50 MG per tablet Take 1 tablet by mouth at bedtime as needed for mild constipation. While taking pain meds to prevent constipation 01/29/15   Rai, Delene Ruffiniipudeep K, MD  Tamsulosin HCl (FLOMAX) 0.4 MG CAPS Take 0.8 mg by mouth daily.    [provider]      Allergies    Patient has no known allergies.    Review of Systems   Review of Systems  Unable to perform ROS: Dementia   Physical Exam Updated Vital Signs BP (!) 188/154    Pulse 81    Temp 98.5 F (36.9 C) (Axillary)    Resp 20    Ht 5\' 4"   (1.626 m)    Wt 49.1 kg    SpO2 92%    BMI 18.58 kg/m  Physical Exam Vitals and nursing note reviewed.  Constitutional:      General: He is not in acute distress.    Appearance: Normal appearance. He is not diaphoretic.  HENT:     Head: Normocephalic.     Mouth/Throat:     Mouth: Mucous membranes are moist.  Cardiovascular:     Rate and Rhythm: Normal rate.  Pulmonary:     Effort: Pulmonary effort is normal. No respiratory distress.     Breath sounds: No wheezing or rales.  Musculoskeletal:     Comments: Right hand skin tear, one closer to base of thumb dorsal aspect, approx 2 cm, bleeding controlled. Larger tear dorsum of hand between thumb and index finger, flap like approx 4 cm, bleeding controlled. Both superficial and not amendable to suturing.   Skin:    General: Skin is warm.  Neurological:     Mental Status: He is alert. Mental status is at baseline.  Psychiatric:        Mood and Affect: Mood normal.    ED Results / Procedures / Treatments   Labs (all labs ordered are listed, but only abnormal results are displayed) Labs Reviewed  RESP PANEL BY  RT-PCR (FLU A&B, COVID) ARPGX2  CBC WITH DIFFERENTIAL/PLATELET    EKG EKG Interpretation  Date/Time:  Monday August 18 2021 06:49:44 EST Ventricular Rate:  70 PR Interval:  171 QRS Duration: 116 QT Interval:  387 QTC Calculation: 418 R Axis:   -79 Text Interpretation: Sinus rhythm Left anterior fascicular block Probable left ventricular hypertrophy Confirmed by Coralee Pesa [phone]) on 08/18/2021 7:13:10 AM  Radiology DG Chest Port 1 View  Result Date: 08/18/2021 CLINICAL DATA:  81 year old male with history of hypoxia. Decreased oxygen saturations. EXAM: PORTABLE CHEST 1 VIEW COMPARISON:  Chest x-ray 08/05/2012. FINDINGS: Lung volumes are normal. Diffuse peribronchial cuffing. No consolidative airspace disease. No pleural effusions. No pneumothorax. No pulmonary nodule or mass noted. Pulmonary vasculature and the  cardiomediastinal silhouette are within normal limits. Atherosclerosis in the thoracic aorta. IMPRESSION: 1. Diffuse peribronchial cuffing, concerning for an acute bronchitis. 2. Aortic atherosclerosis. Electronically Signed   By: Trudie Reed M.D.   On: 08/18/2021 06:58    Procedures Procedures    Medications Ordered in ED Medications - No data to display  ED Course/ Medical Decision Making/ A&P                           Medical Decision Making  Level 5 caveat due to dementia  This patient presents to the ED for concern of hypoxia when not wearing his supplemental oxygen and skin tears, this involves an extensive number of treatment options, and is a complaint that carries with it a high risk of complications and morbidity.  The differential diagnosis includes hypoxia, respiratory infection, pneumonia, skin tear/laceration   Additional history obtained: -Additional history obtained from facility and facility papers -External records from outside source obtained and reviewed including: Chart review including previous notes, labs, imaging, consultation notes   EKG -NSR   Imaging Studies ordered: -I ordered imaging studies including chest x-ray -I independently visualized and interpreted imaging which showed findings of acute bronchitis -I agree with the radiologist interpretation   Medicines ordered and prescription drug management: -I ordered medication including wound care for right hand for skin tears -Reevaluation of the patient after these medicines showed that the patient improved -I have reviewed the patients home medicines and have made adjustments as needed   ED Course: 81 year old demented male presents emergency department concern for noncompliance with nasal cannula, resulting hypoxia and skin tears to the right hand.  Patient reportedly scratched his right hand when being combative.  While patient is wearing his 2 L nasal cannula which is reported from  facility to be baseline he has normal oxygenation and respirations.  No indication of new hypoxia, lung sounds are equal bilateral, no wheezing.  Chest x-ray shows findings of acute bronchitis but no pneumonia or other findings.  Flu and COVID swab is negative.  Do not believe this is something like a PE in the absence of tachycardia and genuine hypoxia.  Do not feel blood work is warranted as well he is wearing his baseline oxygen his vitals are normal.  Patient is at times combative and was placed in mittens here.  The right hand has 2 superficial skin tears, both of which do not need repair however will require wound care for healing.  Patient is constantly trying to pull the dressing off of the right hand and I suspect this will be difficult healing process.  We have recommended use of mittens for eating and healing at the facility. If they cant utilize  mittens we may recommend soft wrist splint to keep dressing in place.    Cardiac Monitoring: The patient was maintained on a cardiac monitor.  I personally viewed and interpreted the cardiac monitored which showed an underlying rhythm of: NSR   Reevaluation: After the interventions noted above, I reevaluated the patient and found that they have :improved   Dispostion: Patient at this time appears safe and stable for discharge and close outpatient follow up with wound care. Discharge plan and strict return to ED precautions discussed, patient verbalizes understanding and agreement.        Final Clinical Impression(s) / ED Diagnoses Final diagnoses:  Bronchitis    Rx / DC Orders ED Discharge Orders          Ordered    guaiFENesin (MUCINEX) 600 MG 12 hr tablet  2 times daily PRN        08/18/21 0904              Criag Wicklund, Clabe Seal, DO 08/18/21 0945    Iline Buchinger, Clabe Seal, DO 08/18/21 [phone]

## 2021-08-18 NOTE — Discharge Instructions (Addendum)
You have been seen and discharged from the emergency department.  Your chest x-ray shows findings of acute bronchitis.  Your flu and COVID swab were negative.  No signs of pneumonia.  Your oxygen was normal on your 2 L nasal cannula requirement.  You may take Mucinex as needed for symptomatic control.  No indication for antibiotics.  In regards to skin tears right hand, keep dressed with antibiotic ointment and wet to dry dressing. Mittens would be beneficial until wound healed enough to be open to air. No suturing indicated.    Follow-up with your primary provider for further evaluation and further care. Take home medications as prescribed. If you have any worsening symptoms or further concerns for your health please return to an emergency department for further evaluation.

## 2021-08-18 NOTE — ED Notes (Signed)
[facility] has been called for transportation back to [facility] and rehab living facility.

## 2021-08-18 NOTE — ED Notes (Addendum)
Mittens placed on patient for patient safety as he should not take off his oxygen or take out his IV.

## 2021-08-18 NOTE — Progress Notes (Signed)
Orthopedic Tech Progress Note Patient Details:  Brian Salinas May 20, 1941 [id number]  Ortho Devices Type of Ortho Device: Velcro wrist splint Ortho Device/Splint Location: right Ortho Device/Splint Interventions: Application   Post Interventions Patient Tolerated: Fair Instructions Provided: Care of device  Saul Fordyce 08/18/2021, 10:16 AM

## 2021-08-18 NOTE — ED Notes (Signed)
Per H[place], MD blood work was cancelled.

## 2022-06-18 ENCOUNTER — Encounter (HOSPITAL_COMMUNITY): Payer: Self-pay
